# Patient Record
Sex: Female | Born: 1990 | Race: Black or African American | Hispanic: No | Marital: Single | Smoking: Never smoker
Health system: Southern US, Community
[De-identification: ages and names within clinical notes are randomized; demographics above are authoritative.]

## PROBLEM LIST (undated history)

## (undated) DIAGNOSIS — J45909 Unspecified asthma, uncomplicated: Secondary | ICD-10-CM

## (undated) DIAGNOSIS — J189 Pneumonia, unspecified organism: Secondary | ICD-10-CM

## (undated) DIAGNOSIS — R569 Unspecified convulsions: Secondary | ICD-10-CM

## (undated) DIAGNOSIS — G809 Cerebral palsy, unspecified: Secondary | ICD-10-CM

## (undated) HISTORY — PX: BACK SURGERY: SHX140

## (undated) HISTORY — PX: HIP SURGERY: SHX245

---
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
## 2010-01-25 ENCOUNTER — Encounter: Admission: RE | Admit: 2010-01-25 | Discharge: 2010-01-25 | Payer: Self-pay | Admitting: General Surgery

## 2010-01-25 ENCOUNTER — Ambulatory Visit: Payer: Self-pay | Admitting: General Surgery

## 2010-08-09 ENCOUNTER — Ambulatory Visit: Payer: Self-pay | Admitting: General Surgery

## 2011-04-06 LAB — COMPREHENSIVE METABOLIC PANEL
ALT: 16 U/L (ref 0–35)
AST: 21 U/L (ref 0–37)
Albumin: 3.1 g/dL — ABNORMAL LOW (ref 3.5–5.2)
Alkaline Phosphatase: 206 U/L — ABNORMAL HIGH (ref 39–117)
BUN: 1 mg/dL — ABNORMAL LOW (ref 6–23)
CO2: 25 mEq/L (ref 19–32)
Calcium: 8.5 mg/dL (ref 8.4–10.5)
Chloride: 104 mEq/L (ref 96–112)
Creatinine, Ser: <0.3 mg/dL — ABNORMAL LOW (ref 0.4–1.2)
Glucose, Bld: 91 mg/dL (ref 70–99)
Potassium: 4.2 mEq/L (ref 3.5–5.1)
Sodium: 136 mEq/L (ref 135–145)
Total Bilirubin: 0.4 mg/dL (ref 0.3–1.2)
Total Protein: 6.6 g/dL (ref 6.0–8.3)

## 2011-05-10 NOTE — Discharge Summary (Signed)
Allison [name], Allison [name]              ACCOUNT NO.:  [id number]   MEDICAL RECORD NO.:  [id number]          PATIENT TYPE:  INP   LOCATION:  6126                         FACILITY:  [facility]   PHYSICIAN:  Dyann Ruddle, MDDATE OF BIRTH:  [birth year]   DATE OF ADMISSION:  04/20/2009  DATE OF DISCHARGE:  04/23/2009                               DISCHARGE SUMMARY   REASON FOR HOSPITALIZATION:  Aspiration event.   FINAL DIAGNOSES:  1. Aspiration.  2. MRCP.  3. Failure to thrive.  4. Seizure disorder.   BRIEF HOSPITAL COURSE:  An 20 year old female with history of MRCP,  admitted status post aspiration event while eating associated with  hypoxia to 70% and new O2 requirement.  The patient was placed on nasal  cannula O2 to keep sats greater than 90%, 88% while asleep.  Initially  made n.p.o. and placed on maintenance IV fluids.  Chest x-ray was  obtained upon admission, which did not show any evidence of infiltrate  or consolidation with aspiration event.  Speech Therapy was consulted  and obtained modified barium swallow evaluation as the patient took thin  liquids at home.  The patient failed modified barium swallow study with  all consistencies including nectar thick.  Oxygen was weaned throughout  admission and the patient was greater than 92% on room air prior to  discharge.  Upper GI study was done as the patient will need G-tube for  further feeding in the long-term setting.  Upper GI revealed normal  anatomy with no evidence of reflux.  No further studies were done as an  inpatient.  The patient will follow up with Pediatric Surgery for G-tube  placement as an outpatient.  Of note, secondary to failed modified  barium swallow study, Panda NG tube was placed and the patient was  started on Ensure tube feeds initially at 6 mL and increase to daily  feeds of 120 mL q.i.d.  At discharge, the patient's physical exam was  pertinent for improved coarse breath sounds bilaterally with no  wheezing.  No retractions.  O2 sat of 94% on room air.   DISCHARGE WEIGHT:  16 kg.   DISCHARGE CONDITION:  Improved.   DISCHARGE DIET:  Ensure NG tube feeds 120 mL q.i.d.   DISCHARGE ACTIVITY:  Ad lib.   PROCEDURES AND OPERATIONS:  Upper GI, normal anatomy, no reflux.   CONSULTANT:  Outpatient Pediatric Surgery for G-tube placement.   HOME MEDICATIONS:  1. Lyrica 75 mg per NG daily.  2. Keppra 1500 mg per NG q.a.m., 1000 mg per NG q.p.m.  3. Felbatol 600 mg per NG b.i.d.  4. Clonazepam 0.5 mg per NG t.i.d.   NEW MEDICATIONS:  Ensure NG feeds 120 mL q.i.d.   DISCONTINUED MEDICATIONS:  None.   PENDING RESULTS:  None.   FOLLOWUP ISSUES:  G-tube placement.   FOLLOWUP:  Dr. Wyline Mood, Pediatric Surgery, on Apr 27, 2009, at 2:45 p.m.  Dr. Oliver Pila, [facility] as needed.      Milinda Antis, MD  Electronically Signed      Dyann Ruddle, MD  Electronically Signed    KD/MEDQ  D:  04/23/2009  T:  04/24/2009  Job:  [id number]   cc:   Linward Headland, M.D.  Bunnie Pion, MD

## 2011-05-13 NOTE — Consult Note (Signed)
Allison [name], Allison [name]                          ACCOUNT NO.:  [id number]   MEDICAL RECORD NO.:  [id number]                   PATIENT TYPE:  INP   LOCATION:  6151                                 FACILITY:  [facility]   PHYSICIAN:  Casimiro Needle L. Thad Ranger, M.D.           DATE OF BIRTH:  03/08/1991   DATE OF CONSULTATION:  DATE OF DISCHARGE:  05/17/2003                                   CONSULTATION   CHIEF COMPLAINT:  Increased seizure activity.   HISTORY OF PRESENT ILLNESS:  This is an inpatient consultation on this  existing [facility] patient, a 20 year old girl with a  past medical history of quadriparetic cerebral palsy and mental retardation  and epilepsy with refractory seizures. Her mother reports that her normal  seizure frequency is about 0 to 5 complex partial seizures a day consisting  of eye fluttering, twitching of the left face and arm. She seemed to be in  her usual state of health. She may have had a few more twitches than usual  last night, but otherwise seemed to be doing quite well when she went off to  [place] this morning. However, once there she was noted to have very  frequent seizures, about 20 to 25 in a row, and then began having apparent  generalized seizure activity with twitching of all extremities. The EMS was  alerted and she received 2 mg of rectal Valium en route. She has had no  further  generalized seizures. She does, however, continue to have frequent  complex partial seizures. She received a fosphenytoin load in the emergency  room and neurologic consultation is requested for further management advice.   PAST MEDICAL HISTORY:  As above. No other chronic  medical problems.   SOCIAL HISTORY:  She lives at home and her mother is the primary care giver.  She is at [employer] during the day. She can take p.o., but is clearly  dependent in activities of daily living.   MEDICATIONS:  1. Felbatol 600 mg per teaspoon 3 cc b.i.d.  2. Klonopin  0.5 mg b.i.d.   REVIEW OF SYSTEMS:  On questioning her mother denies any fever, cough,  congestion, runny nose, nausea, diarrhea or apparent pain.   PHYSICAL EXAMINATION:   VITAL SIGNS:  Temperature 99.4, blood pressure 127/63, pulse 109,  respirations 32.   GENERAL:  The patient appears alert and in no evident distress. She seems to  regard the examiner and grunts occasionally but does not follow commands and  does not produce any formed speech.   NECK:  Supple without carotid bruits.   CHEST:  Clear  to auscultation.   HEART:  Regular rate and rhythm.   NEUROLOGIC:  Cranial nerves, pupils equal and reactive. Extraocular  movements seem to be full. Face was symmetric. On motor examination  she has  increased tone throughout with fair severe quadriparesis. She does withdraw  all extremities to stimuli. Reflexes are  generally increased  and Babinski  signs are present bilaterally.   LABORATORY DATA:  CBC :  White count 11.9, hemoglobin 13.7, platelets  270,000. BMET unremarkable. Urinalysis and urine culture are pending. Blood  culture is pending.   Chest x-ray is normal.   IMPRESSION:  1. 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 The reason is uncertain but there is no definite     infectious focus and the patient has not missed any medication doses.  2. Quadriparetic cerebral palsy.  3. Mental retardation.   PLAN:  The plan was discussed with Dr. Sharene Skeans. We will increase the  Felbatol to 4 cc b.i.d. Dilantin would be an appropriate drug to use as a  bridge. Pediatrics will proceed with a fever workup.  If she feels better it  is OK for her to go home in the morning to follow up with Dr. Sharene Skeans.                                               Michael L. Thad Ranger, M.D.    MLR/MEDQ  D:  05/16/2003  T:  05/17/2003  Job:  [id number]

## 2011-05-13 NOTE — Consult Note (Signed)
Allison [name], Allison [name]              ACCOUNT NO.:  [id number]   MEDICAL RECORD NO.:  [id number]          PATIENT TYPE:  OBV   LOCATION:  6118                         FACILITY:  [facility]   PHYSICIAN:  Bevelyn Buckles. Champey, M.D.DATE OF BIRTH:  1991-12-06   DATE OF CONSULTATION:  05/29/2006  DATE OF DISCHARGE:                                   CONSULTATION   REQUESTING PHYSICIAN:  Dr. Dawna Part   REASON FOR CONSULTATION:  Recurrent seizures.   HISTORY OF PRESENT ILLNESS:  [name] is a 20 year old African-American  female, history of static encephalopathy, mental retardation, seizures, and  neonatal hypoxic ischemic insult.  Presents with increased frequency of  seizures over the past few days.  The patient does see Dr. Sharene Skeans as an  outpatient and is currently on Felbatol and clonazepam for seizure control.  Her last Felbatol level was 90 on May 25, 2006.  Mother states that seizures  have been increasing in frequency over the past few days and have been  fluctuating since Keppra was stopped over a year ago.  She can have no  seizures for a few days and then two to three per day for several days.  The  patient has had numerous seizures this morning, over 20, and mostly consist  of right eye deviation and right upper extremity shaking, usually lasting 10-  20 seconds in duration.  The patient occasionally will come back to baseline  as per mother and then go into another seizure.  The patient's baseline  consists of severe mental retardation, being nonverbal and quadriparetic.  The patient did have a few seizures during this evaluation.   PAST MEDICAL HISTORY:  Positive for static encephalopathy, quadriparetic,  mental retardation, and seizures.   CURRENT MEDICATIONS:  Include Felbatol and clonazepam.   ALLERGIES:  The patient has no known drug allergies.   FAMILY HISTORY:  Negative for seizures.   SOCIAL HISTORY:  The patient lives with mother and sister.  Has mental  retardation.   Is mostly wheelchair bound.   REVIEW OF SYSTEMS:  Positive as per history of present illness.  Is negative  as per history of present illness and also negative for recent illnesses or  ill contacts.   EXAMINATION:  VITAL SIGNS:  Temperature is 97.8, respirations 24, pulse is  93, blood pressure is 126/82.  HEENT:  The patient has a right gaze preference/deviation.  Pupils are equal  and reactive.  HEART:  Regular.  LUNGS:  Clear.  ABDOMEN:  Soft, nontender.  EXTREMITIES:  Show increased tonal contractures, good pulses throughout.  NEUROLOGIC:  The patient is lethargic, nonresponsive, nonverbal.  Cranial  nerves:  The patient has right eye deviation.  Pupils equal, round, and  reactive to light.  Motor examination:  The patient is quadriparetic with  increased tonal contractures in all four extremities.  Reflexes are brisk  throughout.  Cerebellar function and gait are unable to be performed at this  time.   LABORATORY:  Felbatol level on May 25, 2006, was 90.   IMPRESSION:  A 20 year old African-American female with recurrent  seizures/status.  The patient was  already given 2 mg of Ativan in the ED.  After reviewing the old clinic notes of Dr. Sharene Skeans I will add Keppra 1 g  IV x1 now and start the patient on maintenance 500 mg b.i.d.  Will check a  EEG as the tech has been called.  I would recommend ruling out infection  with UA and panculture for other etiologies for increased seizure frequency.  Can use Ativan p.r.n. if needed.  Will continue the Felbatol and clonazepam  as previously prescribed and I will discuss the patient with Dr. Sharene Skeans  who will follow the patient in the morning.  Dr. Dawna Part is told to call with  any questions or concerns.      Bevelyn Buckles. Nash Shearer, M.D.  Electronically Signed     DRC/MEDQ  D:  05/29/2006  T:  05/30/2006  Job:  [id number]

## 2011-05-13 NOTE — Discharge Summary (Signed)
NAMECASSI, Allison [name]                          ACCOUNT NO.:  [id number]   MEDICAL RECORD NO.:  [id number]                   PATIENT TYPE:  INP   LOCATION:  6149                                 FACILITY:  [facility]   PHYSICIAN:  Dwana Curd. Para March, M.D.              DATE OF BIRTH:  1991/04/27   DATE OF ADMISSION:  08/28/2004  DATE OF DISCHARGE:  08/31/2004                                 DISCHARGE SUMMARY   REASON FOR HOSPITALIZATION:  Increasing frequency in seizure activities.   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  Mom reports an increasing frequency in seizures for 2 days before  ED admission.  Dr. Sharene Skeans previously increased the patient's Felbatol dose  but the Mom stated the patient still had an increase in number of seizures.  The patient was given Ativan in the ER secondary to the increased seizure  activity.  The patient was admitted to the 6th floor of [facility]  observation to the pediatric unit.  Nutrition consult was completed for  cachexia.  The PCP faxed a growth chart but is still concerned about the  patient's growth and development.   TREATMENT:  1.  Felbatol 500 mg p.o./NG b.i.d.  2.  Clonazepam 0.5 mg p.o./NG b.i.d.  3.  Keppra 100 mg p.o./NG b.i.d.  4.  Tylenol 325 mg p.o. q.4 h. p.r.n. fever.  5.  Nutrition consult leading to PediaSure 30 mL p.o./NG.   OPERATIONS AND PROCEDURES:  None.   FINAL DIAGNOSIS:  Seizure disorder with increased frequency of seizure now  resolving.   DISCHARGE MEDICATIONS:  1.  Keppra 100 mg per 1 mL solution 1 mL b.i.d.  2.  Clonazepam 0.5 mg p.o./NG b.i.d.  3.  Felbatol 500 mg p.o./NG b.i.d.   DIET:  Pureed diet, continue with normal.  Would follow up with primary care  Emogene Muratalla.   PENDING RESULTS/ISSUES TO BE RESOLVED:  Cachexia/low weight/poor growth to  be followed up by PCP.   FOLLOW UP:   Follow up with Dr. Oliver Pila on Friday, September 9th, at 9:30  a.m.   DISCHARGE WEIGHT:  Not available.   DISCHARGE CONDITION:  Improved.                                                Dwana Curd Para March, M.D.    GSD/MEDQ  D:  08/31/2004  T:  09/01/2004  Job:  [id number]   cc:   Linward Headland, M.D.  [address] [place]  [place] [address]  Fax: [phone]

## 2011-05-13 NOTE — Consult Note (Signed)
Allison [name], BOZARTH              ACCOUNT NO.:  [id number]   MEDICAL RECORD NO.:  [id number]          PATIENT TYPE:  INP   LOCATION:  6151                         FACILITY:  [facility]   PHYSICIAN:  Casimiro Needle L. Reynolds, M.D.DATE OF BIRTH:  04/17/1991   DATE OF CONSULTATION:  DATE OF DISCHARGE:                                   CONSULTATION   REFERRING PHYSICIAN:  Orie Rout, M.D.   REASON FOR EVALUATION:  Seizures.   HISTORY OF PRESENT ILLNESS:  This is an inpatient consultation evaluation of  this existing [facility] patient., a 20 year old girl  with a past medical history that includes extreme prematurity with resultant  microcephaly, quadriparesis and refractory epilepsy with complex partial  seizures.  The patient has been admitted several times with refractory  seizures, most recently in June of this year.  Her present regimen of  medication includes Felbatol 480 mg b.i.d. and Keppra 750 mg b.i.d.  She is  followed in the office by Dr. Sharene Skeans.  The patient was in her usual state  of health until Sunday afternoon, at which time she began having increased  seizure frequency.  Her mother says that she normally has 0 to 3 seizures a  day, but began having frequent seizures on Sunday afternoon, October 01, 2006.  On October 02, 2006, the seizure reduced in frequency a little bit,  but still she was having seizures more frequently than on an hourly basis.  This morning, she began having seizures very frequently, about every 5  minutes or so, and she was also noted to be febrile at home, so she was  brought to the emergency room for evaluation.  The patient's seizures are  described as her usual episodes of adversive seizures with extension on the  right, flexion on the left, associated with head deviation.  The spells last  20 to 30 seconds and then resolve, and there is no postictal confusional  state noted by her mother.  She has not noticed any change in  the patient's  behavior lately.  She continues to tolerate food well.  She has not had any  obvious signs of infection such as runny nose, cough, redness of the skin,  or any other signs.  She has been able to keep down her medications.  She  has not had any vomiting or diarrhea.  In the emergency room, she received  lorazepam and fosphenytoin load and was subsequently admitted for further  observation and management.   PAST MEDICAL HISTORY:  Remarkable for a history of extreme prematurity with  substantial subsequent neurologic injury resulting in mental retardation,  spastic quadriplegia, cerebral palsy, and refractory epilepsy as noted  above.  She has been on several anticonvulsives in the past.  No medical  problems otherwise.   FAMILY/SOCIAL/REVIEW OF SYSTEMS:  As outlined in the admission H and P,  which is reviewed.   MEDICATIONS:  1. Klonopin 0.5 mg b.i.d.  2. Keppra 750 mg b.i.d.  3. Felbatol 480 mg b.i.d.   PHYSICAL EXAMINATION:  VITAL SIGNS:  Temperature 98.3, heart rate 132,  respirations 20, O2  sat 98% on room air, weight 16 kilos.  GENERAL:  This is a chronically ill-appearing girl, supine in the hospital  bed in no evident distress.  HEAD:  Cranium is microcephalic without evidence of acute trauma.  Oropharynx is benign.  NECK:  Supple without carotid bruit.  HEART:  Tachycardic, regular rhythm.  No murmurs.  NEUROLOGIC:  Mental status:  She appears a little bit groggy, but is able to  regard the examiner.  She makes no effort at speech and does not follow  commands.  Cranial nerves:  Pupils are equal and reactive.  She did look to  the left and the right equally well and blinks to threat from both sides.  She has increased facial tone, but moves face and palate slowly and  symmetrically.  MOTOR:  Normal bulk.  She has increased tone with a posture  as spastic quadriparesis.  She is able to demonstrate antigravity strength  in the extremities.  She is able to  withdrawal extremities to pin.  Reflexes  are brisk throughout.  Toes are upgoing bilaterally.   LABORATORY DATA:  CBC, on admission, white count 8.5, hemoglobin 13.7,  platelet 183,000 with a normal differential.  Urinalysis is negative.  CMET  remarkable for an elevated AST and ALT.  Otherwise normal.   IMPRESSION:  Breakthrough partial seizures in a patient with a long history  of refractory epilepsy.  It is unclear what, if anything, precipitated this  episode.   RECOMMENDATIONS:  Would proceed with additional Keppra intravenously and  would increase her regular dose to 1000 mg b.i.d.  For now, will continue  Felbatol at the present dose.  Would hold off on any further dilantin,  unless the seizures do not remit.  Would also hold off on any further  Ativan, unless she develops refractory status, that is a persistent seizure  lasting 5 minutes or more.  She had an EEG today, which will be interpreted  by Dr. Sharene Skeans, who will perform subsequent follow up on the patient.   Thank you for the consultation.      Michael L. Thad Ranger, M.D.  Electronically Signed     MLR/MEDQ  D:  10/03/2006  T:  10/04/2006  Job:  [id number]

## 2011-05-13 NOTE — Procedures (Signed)
PROCEDURE:  Portable electroencephalogram.   NEUROLOGIST:  Bevelyn Buckles. Nash Shearer, M.D.   HISTORY:  This is a 20 year old with increased frequency of seizures.   CURRENT MEDICATIONS:  Felbatol and Klonopin.   DESCRIPTION OF PROCEDURE:  On this portable EEG, there is no distinct or  sustained posterior dominant rhythm noted.  Background activity is fairly  symmetric and mostly comprised of mixed frequency activity and mostly theta  range activity at 10-50 microvolts.  There is superimposed faster beta  activity noticed throughout the majority of the tracing, and there is also  occasional EMG movement artifact which occasionally obscures the background.  There seem to be more prominent beta activity noted in the left greater than  right frontal head region.  There is also on two occassions that there are  frontally predominant beta spikes for 5 seconds duration, that tapers off  with slowing.  It is unclear what is occurring during this timeframe as no  documentation is given.  This could possibly be an electrographic seizure  versus artifact. Occasionally in the left temporal head region there is  occasional small sharp waves noted.  The patient was getting Ativan and  Keppra prior to this tracing.  Photic stimulation nor hyperventilation were  performed throughout this recording.   IMPRESSION:  This portable EEG is abnormal secondary to diffuse slowing,  questionable right hemispheric sharp waves and beta activity most prominent  in the left>right head regions.  Also, two episodes suggestive of  electrographic seizures versus artifact.  The diffuse slowing suggestive of  a toxic metabolic or primary neuronal disorder.  The right hemispheric sharp  waves are suggestive of cortical irritation and possible seizure focus in  the presence of beta in the background that is highly suggestive of  medication effect.  The frontal beta/spikes are also suggestive of seizure  focus.  One would suggest  video/long term monitoring to further delineate.  Clinical correlation is advised.      Bevelyn Buckles. Nash Shearer, M.D.  Electronically Signed     [id number]  D:  05/29/2006 14:54:57  T:  05/30/2006 11:31:48  Job #:  [id number]

## 2011-05-13 NOTE — Procedures (Signed)
EEG NUMBER:  [id number].   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  She was recently admitted  with a flurry of generalized seizures (345.10).   Medications include Felbatol, Lyrica and clonazepam.   PROCEDURE:  The tracing is carried out on a 32-channel digital Cadwell  recorder reformatted into 16-channel montages with one devoted to EKG.  The  patient was awake and asleep during the recording.  The [employer] 10/20  system lead placement was used.   DESCRIPTION OF FINDINGS:  The background activity in the waking state is a  mixture of 4-5 Hz, 20 mV activity.  Superimposed upon this is polymorphic  delta range activity of up to 30 mV.   The most striking finding in the record was spike and slow wave activity of  30 mV spike,  50 mV slow wave activity centered around the central vertex  and to a lesser extent frontal and parietal vertex regions.  These were  interictal discharges which at times were quite frequent.  These became  somewhat more frequent as the patient became drowsy and drifted into natural  sleep with the background consistent showing predominant delta range  activity and sleep spindles.  The sharp wave activity was superimposed upon  vertex sharp waves.   No electrographic seizures were seen.   EKG showed regular sinus rhythm with ventricular response of 96 beats per  minute.   IMPRESSION:  Abnormal EEG on the basis of diffuse background slowing and  significant for the presence of sharply contoured slow wave activity at the  central, frontal and parietal vertex which is epileptogenic from  electrographic viewpoint would correlate with the presence of a seizure  disorder.  Diffuse slowing is indicative of the patient's underlying static  encephalopathy.      Deanna Artis. Sharene Skeans, M.D.  Electronically  Signed     ZOX:WRUE  D:  10/10/2006 07:19:48  T:  10/10/2006 21:00:10  Job #:  [id number]

## 2011-05-13 NOTE — Procedures (Signed)
EEG NUMBER:  [id number].   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   The patient was admitted with frequent episodes of 15- to 30-second episodes  of flexing of her right arm, head turning to the right, and flickering of  her eyelids.  This happens every day, but it is now happening in clusters  despite the fact that she is taking Keppra, Felbatol, and Klonopin.  The  study is being done to evaluate the patient's seizure disorder and make  recommendations for further workup and treatment, 345.01.   PROCEDURE:  The tracing is carried out on a 32-channel digital Cadwell  recorder reformatted into 16 channel montages with one devoted to EKG.  The  patient was awake and drowsy during the recording.  The International 10-20  system of lead placement was used.   DESCRIPTION OF FINDINGS:  The background activity is a mixture of lower  theta/upper delta range activity of 30-40 microvolts and generalized 16-20  Hz 20 microvolt beta range activity.   The patient has independent sharply-contoured slow waves at 01, C3 and CC.   There are two episodes of 20-25 seconds.  They begin with 10 Hz 50 microvolt  alpha range activity that is generalized and transitioned to 5 Hz spike and  slow wave activity lasting for 15 seconds.  The patient has extension of her  arm during the tonic portion of the episode and some clonic activity during  the 5 Hz spike and wave.  The second episode is lasts for 30 seconds and is  evenly split between the 10 Hz tonic phase and the 5 Hz clonic phase.  The  patient is aroused toward the end and has a brief 10 Hz discharge lasting  for 2-3 seconds but this is not associated with any seizure-like behavior.   IMPRESSION:  Abnormal EEG on the basis of  diffuse profound background  slowing.  The patient also has two generalized seizures, lasting 25 and 30  seconds, with generalized tonic activity at the beginning and tonic-clonic  activity at the end.  This is consistent with the patient's history.     Deanna Artis. Sharene Skeans, M.D.  Electronically Signed    ZOX:WRUE  D:  10/03/2006 15:25:31  T:  10/05/2006 08:51:56  Job #:  [id number]   cc:   Pediatric Teaching Service

## 2011-05-13 NOTE — H&P (Signed)
NAMEWILHELMENA, Allison [name]                          ACCOUNT NO.:  [id number]   MEDICAL RECORD NO.:  [id number]                   PATIENT TYPE:  INP   LOCATION:  1827                                 FACILITY:  [facility]   PHYSICIAN:  Genene Churn. Love, M.D.                 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   HISTORY OF PRESENT ILLNESS:  The patient was a 7 pound 15-1/2 ounce product  of a full-term pregnancy, complicated by maternal back pain.  The patient  was noted to have slowed breathing and underwent C-section.  She had  seizures within the first 24 hours and a suspected anoxic brain injury.  She  has had inability to sit, walk, talk, but does verbalize with some baby  sounds.  She can see and opens her mouth when eating when requested.  Her  focal seizures have been characterized by eyes jerkins with nystagmoid  movements on the right, batting of the eyelids, flexion of the right arm at  the elbow with stiffness.  These usually last about 15 seconds.  She  averages about 0-2 per day and can go as long as two days without a seizure.  Recently on Thursday, August 26, 2004, she began having increased  frequency of seizures and Dr. Sharene Skeans increased her Felbatol from 4 mL  b.i.d. to 5 mL b.i.d., which is a total of 1000 mg per day going to 1200 mg  per day.  She has continued to have frequent right face, eye, and arm  seizures and was seen in the emergency room for evaluation.  There has been  no fever or chills or other change in her appetite.  She has been on  Dilantin in the past.  She has been phenobarbital, Tegretol, and Neurontin.  She has not been on Trileptal, Lamictal, Zonegran, Depakote, or  Topamax.  She has been on Felbatol 4 mL b.i.d. and __________ 0.5 mg b.i.d. until  August 26, 2004.  She has had no other medicines in the last 10 years  according to mom.   PAST MEDICAL HISTORY:  1.  Quadriparesis.  2.  Static encephalopathy.  3.  Left brain focal seizures.  4.  Spinal fusion at [facility] in March 2005 from cervical region to the      sacral bone.  5.  Right and left hip surgery in 1997 and repeat in 1999.   She has no known allergies.   MEDICATIONS:  1.  Felbatol, recently increased to 5 mL b.i.d., which would be 600 mg      b.i.d.  2.  Clonazepam 0.5 mg b.i.d.  PHYSICAL EXAMINATION:  GENERAL:  A well-developed, thin black female lying  in bed.  VITAL SIGNS:  Blood pressure lying, right and left arm with an adult cuff  was 70/40, heart rate was 105, she was afebrile.  NEUROLOGIC:  She had episodes in which she would look at the examiner.  There was no speech.  The pupils were reactive.  Both discs were seen and  flat.  The extraocular movements were full.  She had gag.  She had extension  of her right arm, flexion of both legs at the knees, and flexion of her  right arm at the elbow.  She has evidence of a spastic quadriparesis with  increased tone and clonus at both ankles.  She had bilateral upgoing plantar  responses.  HEENT:  General examination revealed the tympanic membrane to be clear.  CHEST:  Lungs were clear.  CARDIAC:  There were no heart murmurs.  ABDOMEN:  Bowel sounds were normal.   IMPRESSION:  1.  Recurrent left brain focal seizures, code 345.41.  2.  Spastic quadriparesis, code 344.01.  3.  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  4.  Scoliosis, status post spinal fusion, code 737.43.   PLAN:  The plan at this time since the patient has received 15 mg of  diazepam per rectum is to give her 1 mg of Ativan IV, load with 290 mg of  Dilantin to be given in fosphenytoin equivalents, then decrease her Felbatol  to 4 mL  b.i.d., continue Klonopin, clonazepam 0.5 mg b.i.d., and start  Keppra 85 mg b.i.d.                                                Genene Churn. Sandria Manly, M.D.    JML/MEDQ  D:  08/28/2004  T:  08/28/2004  Job:  [id number]   cc:   Linward Headland, M.D.  [address] [place]  [place] [address]  Fax: [phone]

## 2011-05-13 NOTE — Discharge Summary (Signed)
Allison [name], Allison [name]              ACCOUNT NO.:  [id number]   MEDICAL RECORD NO.:  [id number]          PATIENT TYPE:  OBV   LOCATION:  6118                         FACILITY:  [facility]   PHYSICIAN:  Henrietta Hoover, MD    DATE OF BIRTH:  Dec 27, 1990   DATE OF ADMISSION:  05/29/2006  DATE OF DISCHARGE:  06/01/2006                                 DISCHARGE SUMMARY   DISCHARGE DIAGNOSES:  1.  Cerebral palsy.  2.  Intractable complex partial seizures.  3.  Spastic quadriplegic.  4.  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   DISCHARGE MEDICATIONS:  1.  Keppra 750 mg p.o. b.i.d.  2.  Clonazepam 0.5 mg 1 tablet t.i.d.  3.  Sorbitol 600 mg/5 mL, 4 mL twice daily.   FOLLOW UP:  The patient is to follow with Dr. Sharene Skeans at [facility] at [phone] in one month.  The patient's mother is  to call to schedule an appointment.   BRIEF HOSPITAL COURSE:  Allison [name] is a 20 year old female with past medical  history significant for spastic quadriplegia, seizure disorder, cerebral  palsy, who was admitted for intractable seizures on May 29, 2006.  The  patient was recently seen by Dr. Sharene Skeans one week prior and had recently  had her medications increased at that time. The patient was brought into the  ER per mom secondary to her seizures increasing from baseline. The patient  has a baseline number of seizures multiple times a day but anywhere from 1-2  times a day that last approximately 20 seconds with eyes rolling back in the  head, facial twitching, and arm twitching, and these resolve to a postictal  state.  The patient is nonverbal and is a spastic quadriplegic.  The patient  was given Ativan while in the ER x2 and was seen by the neurologist.  Dr.  Sharene Skeans was seen. She had an EEG performed and a BOEEG and the results are  pending per their final dictation as noted in E-Chart.  It was noted that  the patient continued to have similar focus as noted  prior with the complex  partial seizures as well as minor motor seizures.  The patient was started  on Keppra during this hospitalization and it was decided to be discharged  home on 750 mg p.o. b.i.d. as well as her home meds as above.  The patient  will be followed up by Dr. Sharene Skeans in approximately one month.  Mom states  that the  seizures have reduced, however, they still respond with stimuli, but she  felt comfortable and the patient was able to take p.o. per mom feeding her a  pureed diet.  The patient's discharge weight is 16.8 kg. Discharge condition  is stable.  The patient will be followed and mom is aware of the plan and  the patient is now ready for discharge home.      Barth Kirks, M.D.    ______________________________  Henrietta Hoover, MD    MB/MEDQ  D:  06/01/2006  T:  06/01/2006  Job:  [id number]   cc:  Linward Headland, M.D.  Fax: [phone]   Deanna Artis. Sharene Skeans, M.D.  Fax: [phone]

## 2011-05-13 NOTE — Discharge Summary (Signed)
Allison [name], Allison [name]              ACCOUNT NO.:  [id number]   MEDICAL RECORD NO.:  [id number]          PATIENT TYPE:  INP   LOCATION:  6151                         FACILITY:  [facility]   PHYSICIAN:  Deanna Artis. Hickling, M.D.DATE OF BIRTH:  08/02/1991   DATE OF ADMISSION:  10/03/2006  DATE OF DISCHARGE:  10/10/2006                                 DISCHARGE SUMMARY   REASON FOR HOSPITALIZATION:  Khalie is a 20 year old female with a history  of cerebral palsy, as well as a seizure disorder.  She was admitted for an  increased frequency of seizures.   FINAL DIAGNOSES:  1. 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  2. Cerebral palsy Spastic Quadriparesis (343.2).   HOSPITAL COURSE:  The patient had seizures every 1 to 2 minutes upon  hospital admission.  According to her mother, her baseline frequency of  seizures is 1 to 3 seizures per day.  The patient was loaded with Dilantin  IV in the emergency department.  Dr. Thad Ranger from neurology evaluated the  patient in the emergency department and increased her home dose of Keppra  from 750 mg b.i.d. to 1000 mg b.i.d.  An EEG was obtained that did capture 2  generalized seizure.  The patient underwent an infectious workup, which  resulted in negative urine and blood cultures.  A comprehensive metabolic  panel and CBC were both unremarkable.  The patient continued to have  seizures approximately every 3 to 4 minutes until Lyrica was added to her  seizure regimen on October 13.  The patient's seizure frequency returned  back to her baseline.  An EEG performed on October 15, showed generalized  slowing with no seizure activity.  The patient was noted to be difficult to  feed and did not swallow her medications readily.  A bedside speech swallow  evaluation was performed on October 16.  The patient was unable to comply  with the swallow evaluation for the speech technician.  The patient's mother  was, however, successful in feeding the  patient and administering oral  medications.  The mother is comfortable with taking the patient home and  feels that her swallowing ability is at her baseline.   DISCHARGE MEDICATIONS:  1. Lyrica 100 mg p.o. twice daily, this is a new medication as of this      hospitalization.  2. Keppra 1000 mg p.o. b.i.d., this medication has been increased from 750      mg p.o. b.i.d.  3. Klonopin 0.5 mg p.o. t.i.d.  4. Felbatol 480 mg p.o. b.i.d.  5. Dilantin 40 mg p.o. every 8 hours.   FOLLOWUP INSTRUCTIONS:  The patient is to follow up with Dr. Ellison Carwin on October 18 at 12:45 p.m.  The patient's mother is aware of this  appointment time.   ISSUES TO BE FOLLOWED UP UPON:  The patient will need to have her Dilantin  level checked at her appointment with Dr. Sharene Skeans on October 18.   A preliminary discharge summary has been faxed to the patient's primary  physician, Dr. Oliver Pila at [facility], as well as to Dr. Ellison Carwin  who has followed the patient throughout this hospitalization.     ______________________________  Sylvan Cheese, M.D.      Deanna Artis. Sharene Skeans, M.D.  Electronically Signed    MJ/MEDQ  D:  10/10/2006  T:  10/11/2006  Job:  [id number]

## 2011-05-13 NOTE — Procedures (Signed)
EEG NUMBER:  [id number].   HISTORY:  This is a 20 year old with intractable seizures who is having  prolonged video EEG monitoring to evaluate for frequency of seizures.   PROCEDURE:  This is prolonged video EEG monitoring.   TECHNICAL DESCRIPTION:  This prolonged video EEG monitoring was performed on  May 30, 2006.  The tracing was from 9:20 a.m. to 11:43 a.m.  Throughout this  several hour prolonged video EEG monitoring there are numerous push button  events noted, up to 17.  Each of these push buttons correlate with  stereotypical events which consist of the patient extending her hand,  opening her mouth, staring off.  Occasionally seems to have some mouth or  tongue twitching, draws up her arms with flexion and increased tone in her  upper extremities.  This episodes usually lasts anywhere from eight to 10  seconds in duration and then the patient opens her eyes and stares off in  the gaze.  All these push button events electrographically are correlated  with the starting of more prominent beta activity frontally followed by  increased slow wave activity from frontally followed by increased  generalized beta activity at 15-16 Hz at 300 microvolts discharges.  These  discharges last eight to 10 seconds in duration and these discharges ceases  with the background consisting of generalized frontally predominant slowing  in the background.  Clinically when the discharge stop patient becomes more  relaxed and opens her eyes.  Each of these push buttons correlate with this  electrographic seizure event as described above and clinically are the same  stereotypical events with each push button.  These electrographic seizures  are of short duration, only a few seconds in duration with the extensive  beta discharge followed by generalized slowing.  The background of this  tracing is fairly symmetric, mostly comprised of mixed frequency activity.  There does at times seem to be more prominent beta  activity and frontally  predominant slowing noted.  The prominent beta is mostly in the left greater  than right frontal head regions   IMPRESSION:  This prolonged video EEG monitoring is abnormal secondary to  numerous push button events that correlate with electrographic seizure  discharge as described above.  These findings were discussed with Dr.  Ellison Carwin on May 30, 2006.  Clinical correlation is advised.      Bevelyn Buckles. Nash Shearer, M.D.  Electronically Signed     ZOX:WRUE  D:  06/01/2006 12:18:46  T:  06/02/2006 07:36:52  Job #:  [id number]

## 2011-10-06 LAB — I-STAT 8, (EC8 V) (CONVERTED LAB)
Acid-Base Excess: 2
Bicarbonate: 25.7 — ABNORMAL HIGH
Glucose, Bld: 85
HCT: 47
Hemoglobin: 16
Operator id: 279831
Potassium: 4.6
Sodium: 138
TCO2: 27

## 2011-10-06 LAB — URINALYSIS, ROUTINE W REFLEX MICROSCOPIC
Bilirubin Urine: NEGATIVE
Glucose, UA: NEGATIVE
Ketones, ur: NEGATIVE
Protein, ur: NEGATIVE

## 2011-10-06 LAB — URINE CULTURE: Culture: NO GROWTH

## 2012-10-27 ENCOUNTER — Encounter (HOSPITAL_COMMUNITY): Payer: Self-pay | Admitting: Emergency Medicine

## 2012-10-27 ENCOUNTER — Inpatient Hospital Stay (HOSPITAL_COMMUNITY)
Admission: EM | Admit: 2012-10-27 | Discharge: 2012-11-06 | DRG: 100 | Disposition: A | Discharge status: Home / Self Care | Payer: 59 | Attending: Neurology | Admitting: Neurology

## 2012-10-27 ENCOUNTER — Emergency Department (HOSPITAL_COMMUNITY): Payer: 59

## 2012-10-27 DIAGNOSIS — G809 Cerebral palsy, unspecified: Secondary | ICD-10-CM | POA: Diagnosis present

## 2012-10-27 DIAGNOSIS — Q02 Microcephaly: Secondary | ICD-10-CM

## 2012-10-27 DIAGNOSIS — I498 Other specified cardiac arrhythmias: Secondary | ICD-10-CM | POA: Diagnosis present

## 2012-10-27 DIAGNOSIS — G40901 Epilepsy, unspecified, not intractable, with status epilepticus: Secondary | ICD-10-CM | POA: Diagnosis present

## 2012-10-27 DIAGNOSIS — R569 Unspecified convulsions: Secondary | ICD-10-CM | POA: Diagnosis present

## 2012-10-27 DIAGNOSIS — E876 Hypokalemia: Secondary | ICD-10-CM

## 2012-10-27 DIAGNOSIS — Z79899 Other long term (current) drug therapy: Secondary | ICD-10-CM

## 2012-10-27 DIAGNOSIS — G808 Other cerebral palsy: Secondary | ICD-10-CM | POA: Diagnosis present

## 2012-10-27 DIAGNOSIS — G40401 Other generalized epilepsy and epileptic syndromes, not intractable, with status epilepticus: Principal | ICD-10-CM | POA: Diagnosis present

## 2012-10-27 DIAGNOSIS — G40319 Generalized idiopathic epilepsy and epileptic syndromes, intractable, without status epilepticus: Secondary | ICD-10-CM

## 2012-10-27 DIAGNOSIS — G9349 Other encephalopathy: Secondary | ICD-10-CM | POA: Diagnosis present

## 2012-10-27 HISTORY — DX: Cerebral palsy, unspecified: G80.9

## 2012-10-27 HISTORY — DX: Unspecified convulsions: R56.9

## 2012-10-27 LAB — CBC WITH DIFFERENTIAL/PLATELET
Basophils Absolute: 0 10*3/uL (ref 0.0–0.1)
Eosinophils Absolute: 0.2 10*3/uL (ref 0.0–0.7)
Lymphocytes Relative: 28 % (ref 12–46)
Lymphs Abs: 2.2 10*3/uL (ref 0.7–4.0)
MCH: 31.9 pg (ref 26.0–34.0)
Neutrophils Relative %: 62 % (ref 43–77)
Platelets: 168 10*3/uL (ref 150–400)
RBC: 4.54 MIL/uL (ref 3.87–5.11)
RDW: 12.3 % (ref 11.5–15.5)
WBC: 8 10*3/uL (ref 4.0–10.5)

## 2012-10-27 LAB — URINALYSIS, ROUTINE W REFLEX MICROSCOPIC
Hgb urine dipstick: NEGATIVE
Leukocytes, UA: NEGATIVE
Nitrite: NEGATIVE
Protein, ur: NEGATIVE mg/dL
Specific Gravity, Urine: 1.007 (ref 1.005–1.030)
Urobilinogen, UA: 0.2 mg/dL (ref 0.0–1.0)

## 2012-10-27 LAB — BASIC METABOLIC PANEL
GFR calc non Af Amer: >90 mL/min (ref >90)
Glucose, Bld: 80 mg/dL (ref 70–99)
Potassium: 3.8 mEq/L (ref 3.5–5.1)
Sodium: 139 mEq/L (ref 135–145)

## 2012-10-27 MED ORDER — LORAZEPAM 2 MG/ML IJ SOLN
1.0000 mg | INTRAMUSCULAR | Status: DC | PRN
  Administered 2012-10-27 – 2012-11-02 (×13): 1 mg via INTRAVENOUS
  Filled 2012-10-27 (×10): qty 1

## 2012-10-27 MED ORDER — LORAZEPAM 2 MG/ML IJ SOLN
INTRAMUSCULAR | Status: AC
  Administered 2012-10-27: 1 mg
  Filled 2012-10-27: qty 1

## 2012-10-27 MED ORDER — SODIUM CHLORIDE 0.9 % IV SOLN
500.0000 mg | INTRAVENOUS | Status: AC
  Administered 2012-10-27: 500 mg via INTRAVENOUS
  Filled 2012-10-27: qty 5

## 2012-10-27 MED ORDER — FELBAMATE 600 MG/5ML PO SUSP
600.0000 mg | Freq: Two times a day (BID) | ORAL | Status: DC
  Administered 2012-10-27 – 2012-10-28 (×2): 600 mg
  Filled 2012-10-27 (×4): qty 5

## 2012-10-27 MED ORDER — CLONAZEPAM 0.1 MG/ML ORAL SUSPENSION
1.0000 mg | Freq: Once | ORAL | Status: AC
  Administered 2012-10-27: 1 mg via ORAL
  Filled 2012-10-27: qty 10

## 2012-10-27 MED ORDER — ASPIRIN 81 MG PO CHEW: 324.0000 mg | CHEWABLE_TABLET | ORAL | Status: AC

## 2012-10-27 MED ORDER — SODIUM CHLORIDE 0.9 % IV SOLN: 250.0000 mL | INTRAVENOUS | Status: DC | PRN

## 2012-10-27 MED ORDER — LEVETIRACETAM 500 MG PO TABS
500.0000 mg | ORAL_TABLET | Freq: Once | ORAL | Status: AC
  Administered 2012-10-27: 500 mg via ORAL
  Filled 2012-10-27: qty 1

## 2012-10-27 MED ORDER — DEXTROSE-NACL 5-0.45 % IV SOLN
INTRAVENOUS | Status: DC
  Administered 2012-10-28: 07:00:00 via INTRAVENOUS

## 2012-10-27 MED ORDER — CLONAZEPAM 0.5 MG PO TABS
0.5000 mg | ORAL_TABLET | Freq: Three times a day (TID) | ORAL | Status: DC
Start: 2012-10-27 — End: 2012-11-06
  Administered 2012-10-27 – 2012-11-06 (×29): 0.5 mg via ORAL
  Filled 2012-10-27 (×29): qty 1

## 2012-10-27 MED ORDER — PREGABALIN 75 MG PO CAPS
75.0000 mg | ORAL_CAPSULE | Freq: Every day | ORAL | Status: DC
  Administered 2012-10-28 – 2012-11-05 (×9): 75 mg via ORAL
  Filled 2012-10-27 (×9): qty 1

## 2012-10-27 MED ORDER — FELBAMATE 600 MG/5ML PO SUSP: 600.0000 mg | Freq: Two times a day (BID) | ORAL | Status: DC

## 2012-10-27 MED ORDER — SODIUM CHLORIDE 0.9 % IV SOLN
1500.0000 mg | Freq: Two times a day (BID) | INTRAVENOUS | Status: DC
  Administered 2012-10-28 – 2012-11-06 (×19): 1500 mg via INTRAVENOUS
  Filled 2012-10-27 (×21): qty 15

## 2012-10-27 MED ORDER — ASPIRIN 300 MG RE SUPP
300.0000 mg | RECTAL | Status: AC
  Administered 2012-10-27: 300 mg via RECTAL
  Filled 2012-10-27: qty 1

## 2012-10-27 MED ORDER — IBUPROFEN 100 MG/5ML PO SUSP
200.0000 mg | ORAL | Status: DC | PRN
  Filled 2012-10-27: qty 10

## 2012-10-27 MED ORDER — SODIUM CHLORIDE 0.9 % IV SOLN
1500.0000 mg | INTRAVENOUS | Status: AC
  Filled 2012-10-27: qty 15

## 2012-10-27 MED ORDER — LORAZEPAM 2 MG/ML IJ SOLN
1.0000 mg | Freq: Once | INTRAMUSCULAR | Status: AC
  Administered 2012-10-27: 1 mg via INTRAVENOUS
  Filled 2012-10-27: qty 1

## 2012-10-27 MED ORDER — HEPARIN SODIUM (PORCINE) 5000 UNIT/ML IJ SOLN
5000.0000 [IU] | Freq: Three times a day (TID) | INTRAMUSCULAR | Status: DC
  Administered 2012-10-27 – 2012-10-29 (×5): 5000 [IU] via SUBCUTANEOUS
  Filled 2012-10-27 (×8): qty 1

## 2012-10-27 MED ORDER — SODIUM CHLORIDE 0.9 % IV BOLUS (SEPSIS)
500.0000 mL | Freq: Once | INTRAVENOUS | Status: AC
  Administered 2012-10-27: 1000 mL via INTRAVENOUS

## 2012-10-27 MED ORDER — SODIUM CHLORIDE 0.9 % IV BOLUS (SEPSIS)
500.0000 mL | Freq: Once | INTRAVENOUS | Status: AC
  Administered 2012-10-27: 500 mL via INTRAVENOUS

## 2012-10-27 MED ORDER — SODIUM CHLORIDE 0.9 % IV SOLN: 1000.0000 mg | Freq: Once | INTRAVENOUS | Status: DC

## 2012-10-27 NOTE — ED Notes (Signed)
IV team arrived to establish line.   Unsuccessful previous attempts by Jae Dire, RN and Shelley Cocke, RN.

## 2012-10-27 NOTE — ED Notes (Signed)
Called report to Clydie Braun, [place] in [phone].

## 2012-10-27 NOTE — ED Provider Notes (Signed)
Allison [name] is a 21 y.o. female in CDU for observation from pod A. Sign out from PA Upstill as follows: Patient has past medical history significant for cerebral palsy and seizure disorder. She presents today with seizure, fever and emesis. Neurology was consult the has recommended supplementation of her Keppra additional 500 mg in addition to 1 mg of Klonopin. They have advised [place] to observe her for several hours and see if her seizure activity decreases. The seizure activity decreases the plan is to increase her Keppra dose as follows: Currently patient takes 1500 mg in a.m. And 1000 mg at night. Increase to 1500 at night for 2 nights or until no further fever. Follow up with neurology next week.   1:29 PM patient seen and examined. Mother not present. Patient is sleeping, nonverbal. In no acute distress. Lung sounds are clear to auscultation bilaterally heart is regular rate and rhythm and abdominal exam is nontender with no peritoneal signs.  Filed Vitals:   10/27/12 1106 10/27/12 1115 10/27/12 1215 10/27/12 1312  BP: 101/64 103/58 107/65 112/63  Pulse: 75 83 84 91  Temp:      TempSrc:      Resp: 18     SpO2: 100% 97% 99% 99%   Patient signed out to NP Katrinka Blazing at shift change.   Wynetta Emery, PA-C 10/29/12 [phone]

## 2012-10-27 NOTE — ED Notes (Signed)
Family at bedside. 

## 2012-10-27 NOTE — ED Provider Notes (Signed)
Patient continues to have frequent, short interval seizures. Per recommendation of neurology Devonne Doughty), patient received additional 500 mg IV dose of keppra.  One hour after completion of IV dose, patient continues to have frequent seizures.  Neurology inquired if patient could be admitted to pediatrics.  Discussed case with pediatrics resident, who indicates that her attending advises patient cannot be admitted to pediatrics service as patient was recently discharged from her prior pediatric practice.  Patient admission request made to unassigned medicine (TH/AC team 2).  Due to frequency and persistence of seizures, admitting service changed to CCM--patient will be admitted to ICU.  Jimmye Norman, NP 10/28/12 [phone]

## 2012-10-27 NOTE — ED Notes (Signed)
Patients mother claims that patient started running fevers on Thursday.  Patient started seizing Tuesday afternoon but states that the "fevers make her seize more".

## 2012-10-27 NOTE — ED Notes (Signed)
Report given to Greg, RN.

## 2012-10-27 NOTE — ED Provider Notes (Signed)
History     CSN: [id number]  Arrival date & time 10/27/12  1191   First MD Initiated Contact with Patient 10/27/12 [phone]      Chief Complaint  Patient presents with  . Seizures  . Fever    (Consider location/radiation/quality/duration/timing/severity/associated sxs/prior treatment) HPI Pt is a 21 yo female with cerebral palsy who presents to the ER with fever and seizures.  History is provided by her mother who appears to be an excellent caregiver.  Per mother fevers have been on and off for 3 days.  Highest temperature was 101F.  Pt is on several medications for seizures and occasionally has breakthrough seizures.  Patient has had increased number of seizures. Up to 10 seizures yesterday. Pt has PEG tube.  Pt is usually nonverbal.  Pt is usually more alert than she is now, otherwise no baseline change in her mental function.  Pt has had no upper respiratory symptoms.  Mother denies diarrhea, vomiting, changes in urination, urine malodor, and sick contacts.    Past Medical History  Diagnosis Date  . Seizures   . Cerebral palsy     Past Surgical History  Procedure Date  . Back surgery   . Hip surgery     bilateral    No family history on file.  History  Substance Use Topics  . Smoking status: Never Smoker   . Smokeless tobacco: Not on file  . Alcohol Use: No    OB History    Grav Para Term Preterm Abortions TAB SAB Ect Mult Living                  Review of Systems  Constitutional: Positive for fever.  Respiratory: Negative for cough.   Gastrointestinal: Negative for vomiting and diarrhea.  Genitourinary: Negative for difficulty urinating.  Skin: Negative for rash and wound.  Neurological: Positive for seizures.  ROS per caregiver  Allergies  Review of patient's allergies indicates no known allergies.  Home Medications  No current outpatient prescriptions on file.  BP 90/72  Pulse 95  Temp 97.8 F (36.6 C) (Oral)  Resp 22  SpO2 100%  Physical Exam    Constitutional: She appears well-developed and well-nourished. No distress.  HENT:  Head: Normocephalic and atraumatic.  Mouth/Throat: Mucous membranes are dry.  Pulmonary/Chest: Effort normal and breath sounds normal. No respiratory distress.  Abdominal: Soft. She exhibits no distension. There is no tenderness.  Neurological: She is alert.       Patient is nonverbal, does not follow command at baseline. She is awake and follows movement. Actively seizing - generalized seizure without vomiting of 15-20 second duration.   Skin: Skin is warm and dry. No rash noted.    ED Course  Procedures (including critical care time)   Labs Reviewed  CBC WITH DIFFERENTIAL  BASIC METABOLIC PANEL  URINALYSIS, ROUTINE W REFLEX MICROSCOPIC   Results for orders placed during the hospital encounter of 10/27/12  CBC WITH DIFFERENTIAL      Component Value Range   WBC 8.0  4.0 - 10.5 K/uL   RBC 4.54  3.87 - 5.11 MIL/uL   Hemoglobin 14.5  12.0 - 15.0 g/dL   HCT 95.6  21.3 - 08.6 %   MCV 92.1  78.0 - 100.0 fL   MCH 31.9  26.0 - 34.0 pg   MCHC 34.7  30.0 - 36.0 g/dL   RDW 57.8  46.9 - 62.9 %   Platelets 168  150 - 400 K/uL   Neutrophils Relative  62  43 - 77 %   Neutro Abs 4.9  1.7 - 7.7 K/uL   Lymphocytes Relative 28  12 - 46 %   Lymphs Abs 2.2  0.7 - 4.0 K/uL   Monocytes Relative 7  3 - 12 %   Monocytes Absolute 0.6  0.1 - 1.0 K/uL   Eosinophils Relative 3  0 - 5 %   Eosinophils Absolute 0.2  0.0 - 0.7 K/uL   Basophils Relative 1  0 - 1 %   Basophils Absolute 0.0  0.0 - 0.1 K/uL  BASIC METABOLIC PANEL      Component Value Range   Sodium 139  135 - 145 mEq/L   Potassium 3.8  3.5 - 5.1 mEq/L   Chloride 101  96 - 112 mEq/L   CO2 25  19 - 32 mEq/L   Glucose, Bld 80  70 - 99 mg/dL   BUN 9  6 - 23 mg/dL   Creatinine, Ser 2.44 (*) 0.50 - 1.10 mg/dL   Calcium 9.5  8.4 - 01.0 mg/dL   GFR calc non Af Amer >90  >90 mL/min   GFR calc Af Amer >90  >90 mL/min  URINALYSIS, ROUTINE W REFLEX MICROSCOPIC       Component Value Range   Color, Urine YELLOW  YELLOW   APPearance CLEAR  CLEAR   Specific Gravity, Urine 1.007  1.005 - 1.030   pH 6.0  5.0 - 8.0   Glucose, UA NEGATIVE  NEGATIVE mg/dL   Hgb urine dipstick NEGATIVE  NEGATIVE   Bilirubin Urine NEGATIVE  NEGATIVE   Ketones, ur 40 (*) NEGATIVE mg/dL   Protein, ur NEGATIVE  NEGATIVE mg/dL   Urobilinogen, UA 0.2  0.0 - 1.0 mg/dL   Nitrite NEGATIVE  NEGATIVE   Leukocytes, UA NEGATIVE  NEGATIVE    No results found.   No diagnosis found.    MDM  Reviewed old chart. Patient continues to seize, with increasing frequency per mom. Klonpin ordered, 1mg . Discussed with neurology who recommended 500 mg Keppra dose now as well as Klonopin and monitor for 2 hours for improvement to seizures. If improved and seizures decrease in frequency, patient can be discharged with dosage increase of Keppra as follows:  Currently patient takes 1500 mg in a.m. And 1000 mg at night. Increase to 1500 at night for 2 nights or until no further fever. Follow up with neurology next week.   Move patient to CDU to monitor for seizure improvement.        Rodena Medin, PA-C 10/27/12 1308

## 2012-10-27 NOTE — ED Provider Notes (Signed)
Medical screening examination/treatment/procedure(s) were conducted as a shared visit with non-physician practitioner(s) and myself.  I personally evaluated the patient during the encounter   Loren Racer, MD 10/27/12 1504

## 2012-10-27 NOTE — ED Notes (Signed)
CT called, patient finished contrast

## 2012-10-27 NOTE — Progress Notes (Signed)
Called to see Pt Allison [name]  21 year old female with cerebral palsy and seizures followed by Dr. Sharene Skeans.per PA in the EDsince arrival this a.m.(at about 9:00 AM)she has been having seizures-about 2 per hour lasting about 10-35 seconds at a time, and patient regaining consciousness between episodes, but this has been persistent despite receiving her outpatient Keppra Klonopin and an additional dose of IV Keppra as directed per neurologist on-call-Dr. [phone]) at the time of my visit about an hour following the IV Keppra patient is still having seizures-2 generalized tonic-clonic seizures eachLasting a few seconds at a time.I spoke with the on-call neurologist-Dr. Devonne Doughty and he states if patient is continuing to have seizures she'll need to be admitted to the ICU and he can be contacted at [phone].I discussed patient with Dr. Judithann Sheen. Link she states CCM MD will see her for admission to ICU.  Donnalee Curry MD [facility] [phone]

## 2012-10-27 NOTE — H&P (Signed)
Name: Allison [name] MRN: [id number] DOB: 1991/10/30    LOS: 0  Referring Provider:  Jimmye Norman, PA Reason for Referral:  Status epilepticus  PULMONARY / CRITICAL CARE MEDICINE  HPI:  Allison [name] is a 21 y/o female with past medical history of cerebral palsy and mental retardation with a long standing history of seizures previously controlled on 4 home agents.  Her mother brought her to the [facility] ED today with 4 days of fever and increased seizure activity.  Her mother reports no other associated symptoms, only fever to 101 for the last 4 days and seizures about twice an hour that have increased in frequency from her baseline of 0-1 per day.  Allison [name] is on keppra, felbemate, klonipin and lyrica for seizures and is usually well controlled unless she gets a fever or other illness.  She was last hospitalized for about 3 years ago and at that time she was hosplitalized for wheezing, not for seizures.  She had basic labs in the ED which were all normal (BMP, CBC) a chest x-ray with no evidence of infiltrate, and a UA which was not suspicious for UTI.  Her mother reports that she has not had any coughing, increased rhinorrhea or congestion, no diarrhea or constipation, no vomiting and no evidence of pain.  Allison [name] is non-verbal at baseline but does vocalize by cooing, she smiles and is interactive.  Her mother reports that between seizures she has been returning to her baseline.  While I was seeing her in the ED she had tonic clonic seizures starting with eye deviation, then marching down her face and then her arms.  These would last 20-30 seconds and occur every few minutes.  She would become tachycardic with these events but would not desaturate.  Her mother reports that she has been having seizures with this frequency since this morning.   Past Medical History  Diagnosis Date  . Seizures   . Cerebral palsy    Past Surgical History  Procedure Date  . Back surgery   . Hip surgery     bilateral    Prior to Admission medications   Medication Sig Start Date End Date Taking? Authorizing Provider  clonazePAM (KLONOPIN) 0.5 MG tablet Take 0.5 mg by mouth 3 (three) times daily.   Yes Historical Provider, MD  felbamate (FELBATOL) 600 MG/5ML suspension Take 5 mg by mouth 2 (two) times daily. 1 teaspoonful twice per day   Yes Historical Provider, MD  ibuprofen (ADVIL,MOTRIN) 100 MG/5ML suspension Take 200 mg by mouth every 4 (four) hours as needed. For fever   Yes Historical Provider, MD  levETIRAcetam (KEPPRA) 1000 MG tablet Take 1,000-1,500 mg by mouth 2 (two) times daily. 1.5 tablets in the morning and 1 tablet at night   Yes Historical Provider, MD  pregabalin (LYRICA) 75 MG capsule Take 75 mg by mouth daily.   Yes Historical Provider, MD   Allergies No Known Allergies  Family History History reviewed. No pertinent family history. No family history of sz disorder Social History  reports that she has never smoked. She does not have any smokeless tobacco history on file. She reports that she does not drink alcohol or use illicit drugs.  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 seizure disorder admitted in status epilepticus  Events Since Admission: Increase in keppra dosing  Current Status: Critical  Vital Signs: Temp:  [97.7  F (36.5 C)-98.6 F (37 C)] 98.6 F (37 C) (11/02 1622) Pulse Rate:  [75-128] 128  (11/02 2000) Resp:  [16-22] 16  (11/02 1844) BP: (90-134)/(58-93) 119/73 mmHg (11/02 2000) SpO2:  [95 %-100 %] 95 % (11/02 2000)  Physical Examination: General:  Laying on stretcher, non-verbal, non-interactive, frequent seizures Neuro:   left arm contracted, frequent tonic clonic seizures, reflexes 2+ HEENT:  PERRL, EOMI, OP clear, no tonisillar exudate, +tonsillar hypertrophy, no erythema Neck:  Supple, moves head from left to right willingly, shotty cervical  LAD  Cardiovascular:  Tachycardic, regular rhythm, no mrg  Lungs:  CTAB Abdomen:  Soft, NTND, +BS, G-tube in place  Musculoskeletal:  Left arm contracted at elbow, legs stiff Skin:  No rash, intact  Principal Problem:  *Status epilepticus Active Problems:  Seizures  Cerebral palsy   ASSESSMENT AND PLAN  PULMONARY No results found for this basename: PHART:5,PCO2:5,PCO2ART:5,PO2ART:5,HCO3:5,O2SAT:5 in the last 168 hours Ventilator Settings:   CXR:  No infiltrate ETT:  none  A:  History of wheezing, no current problems P:   Continuous pulse ox O2 if needed with seizure activity  CARDIOVASCULAR No results found for this basename: TROPONINI:5,LATICACIDVEN:5, O2SATVEN:5,PROBNP:5 in the last 168 hours ECG: sinus tachycardic Lines: none  A: Sinus tacchycardia P:  Bolus NS 500cc Likely related to persistent seizure activity.  Continuous cardiac monitoring IV fluids 50cc/hr  RENAL  Lab 10/27/12 0940  NA 139  K 3.8  CL 101  CO2 25  BUN 9  CREATININE 0.29*  CALCIUM 9.5  MG --  PHOS --   Intake/Output    None    Foley:  none  A:  No current problems P:   IVF d51/2 at 50cc/hr until seizures controlled  GASTROINTESTINAL No results found for this basename: AST:5,ALT:5,ALKPHOS:5,BILITOT:5,PROT:5,ALBUMIN:5 in the last 168 hours  A:  No current problems P:   Home feeds are jevity 1 can TID, will hold until seizure activity controlled.   HEMATOLOGIC  Lab 10/27/12 0940  HGB 14.5  HCT 41.8  PLT 168  INR --  APTT --   A:  No current problems P:  Monitor cbc as needed  INFECTIOUS  Lab 10/27/12 0940  WBC 8.0  PROCALCITON --   Cultures: none Antibiotics: none  A:  Fever with no obvious source of infection. P:   Pt has been afebrile since admission with normal white count and normal UA Culture blood and urine with new fever Consider LP with new fever Hold on antibiotics with no source.  ENDOCRINE No results found for this basename: GLUCAP:5 in  the last 168 hours A:  No current problems   P:   Monitor blood sugar on BMP in AM  NEUROLOGIC  A:  Status epilepticus in pt with known seizure disorder P:   Seen by neurology - recommended increaseing keppra dose to 1500mg  BID Continue home felbamate, klonipin and lyrica Ativan 1mg  IV for seizure lasting longer than 5 min, or more than 3 seizures of any duration in 5 minute period.   BEST PRACTICE / DISPOSITION Level of Care:  ICU Primary Service:  PCCM Consultants:  neurology Code Status:  full Diet:  jevity via tube DVT Px:  Heparin/asa GI Px:  none Skin Integrity:  good Social / Family:  Mother at bedside.   Carolan Clines., M.D. Pulmonary and Critical Care Medicine [facility] Pager: [phone]  I spent 30 minutes of critical care time in the care of this patient.   10/27/2012, 8:25 PM

## 2012-10-27 NOTE — ED Notes (Signed)
Called pharmacy stated will be sending medication secured code as soon as possible.

## 2012-10-27 NOTE — ED Notes (Signed)
Patient continues to have seizures, more frequently.

## 2012-10-27 NOTE — ED Notes (Signed)
Patient has a peg tube left of midline that is capped.

## 2012-10-27 NOTE — ED Notes (Signed)
I inserted the in and out and Mellody Dance the emt held her legs and we cleaned her up.10:33am JG

## 2012-10-28 ENCOUNTER — Inpatient Hospital Stay (HOSPITAL_COMMUNITY): Payer: 59

## 2012-10-28 DIAGNOSIS — G40401 Other generalized epilepsy and epileptic syndromes, not intractable, with status epilepticus: Principal | ICD-10-CM

## 2012-10-28 LAB — BASIC METABOLIC PANEL
BUN: 7 mg/dL (ref 6–23)
BUN: 4 mg/dL — ABNORMAL LOW (ref 6–23)
CO2: 17 mEq/L — ABNORMAL LOW (ref 19–32)
CO2: 20 mEq/L (ref 19–32)
Calcium: 8.6 mg/dL (ref 8.4–10.5)
Calcium: 8.8 mg/dL (ref 8.4–10.5)
Creatinine, Ser: 0.29 mg/dL — ABNORMAL LOW (ref 0.50–1.10)
Creatinine, Ser: 0.28 mg/dL — ABNORMAL LOW (ref 0.50–1.10)

## 2012-10-28 LAB — CBC
MCH: 31.6 pg (ref 26.0–34.0)
MCV: 93.1 fL (ref 78.0–100.0)
Platelets: 173 10*3/uL (ref 150–400)
RBC: 3.89 MIL/uL (ref 3.87–5.11)
RDW: 12.4 % (ref 11.5–15.5)

## 2012-10-28 LAB — LACTIC ACID, PLASMA: Lactic Acid, Venous: 1.1 mmol/L (ref 0.5–2.2)

## 2012-10-28 LAB — MRSA PCR SCREENING: MRSA by PCR: NEGATIVE

## 2012-10-28 MED ORDER — FELBAMATE 600 MG/5ML PO SUSP
480.0000 mg | ORAL | Status: DC
  Administered 2012-10-28 – 2012-11-06 (×10): 480 mg
  Filled 2012-10-28 (×11): qty 4

## 2012-10-28 MED ORDER — FELBAMATE 600 MG/5ML PO SUSP
480.0000 mg | ORAL | Status: DC
  Administered 2012-10-29 – 2012-11-06 (×9): 480 mg via ORAL
  Filled 2012-10-28 (×11): qty 4

## 2012-10-28 MED ORDER — DEXTROSE-NACL 5-0.45 % IV SOLN
INTRAVENOUS | Status: DC
Start: 2012-10-28 — End: 2012-10-29
  Administered 2012-10-28: 75 mL/h via INTRAVENOUS

## 2012-10-28 MED ORDER — FELBAMATE 600 MG/5ML PO SUSP
360.0000 mg | ORAL | Status: DC
  Administered 2012-10-28 – 2012-11-05 (×9): 360 mg via ORAL
  Filled 2012-10-28 (×10): qty 3

## 2012-10-28 NOTE — Consult Note (Signed)
Allison [name] is an 21 y.o. female. MRN: [id number] DOB: 01-05-1991  Reason for Consult: Evaluation for frequent seizures  Referring Physician: D. 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  She generally has infrequent seizures. She has been followed by Dr. Sharene Skeans in our  practice, on multiple antiepileptic medications with fairly good control in the past few years. She was brought to ED  with a few days of low-grade fever and increased seizure activity. Her mother reports no other associated symptoms, only fever to 101, she has had mild cold symptoms, no fall, no change in her medications, no missing medication, seizures about twice an hour that have increased in frequency from her baseline of 0-1 per day. Allison [name] is on keppra, felbemate, klonipin and lyrica for seizures. She received extra 500 mg of Keppra and 1 mg of Klonopin, she continued frequent brief seizures, she received another 500 mg of Keppra but the brief seizures continued and she was admitted in ICU for monitoring. She had basic labs in the ED which were all normal (BMP, CBC) a chest x-ray with no evidence of infiltrate, and a UA which was not suspicious for UTI. Allison [name] is non-verbal at baseline. During my exam in ICU she had a few brief seizures lasting 20-30 seconds, with lateral gaze of the eyes to the right and stiffness and jerking movements of the right upper extremity and then generalized activity, resolve spontaneously.   Past Medical History  Diagnosis Date  . Seizures   . Cerebral palsy    Prior to Admission medications   Medication Sig Start Date End Date Taking? Authorizing Provider  clonazePAM (KLONOPIN) 0.5 MG tablet Take 0.5 mg by mouth 3 (three) times daily.   Yes Historical Provider, MD  felbamate  (FELBATOL) 600 MG/5ML suspension Take 5 mg by mouth 2 (two) times daily. 1 teaspoonful twice per day   Yes Historical Provider, MD  ibuprofen (ADVIL,MOTRIN) 100 MG/5ML suspension Take 200 mg by mouth every 4 (four) hours as needed. For fever   Yes Historical Provider, MD  levETIRAcetam (KEPPRA) 1000 MG tablet Take 1,000-1,500 mg by mouth 2 (two) times daily. 1.5 tablets in the morning and 1 tablet at night   Yes Historical Provider, MD  pregabalin (LYRICA) 75 MG capsule Take 75 mg by mouth daily.   Yes Historical Provider, MD   Results for orders placed during the hospital encounter of 10/27/12 (from the past 48 hour(s))  CBC WITH DIFFERENTIAL     Status: Normal   Collection Time   10/27/12  9:40 AM      Component Value Range Comment   WBC 8.0  4.0 - 10.5 K/uL    RBC 4.54  3.87 - 5.11 MIL/uL    Hemoglobin 14.5  12.0 - 15.0 g/dL    HCT 95.6  21.3 - 08.6 %    MCV 92.1  78.0 - 100.0 fL    MCH 31.9  26.0 - 34.0 pg    MCHC 34.7  30.0 - 36.0 g/dL    RDW 57.8  46.9 - 62.9 %    Platelets 168  150 - 400 K/uL    Neutrophils Relative 62  43 - 77 %    Neutro Abs 4.9  1.7 - 7.7 K/uL    Lymphocytes Relative 28  12 - 46 %    Lymphs Abs 2.2  0.7 -  4.0 K/uL    Monocytes Relative 7  3 - 12 %    Monocytes Absolute 0.6  0.1 - 1.0 K/uL    Eosinophils Relative 3  0 - 5 %    Eosinophils Absolute 0.2  0.0 - 0.7 K/uL    Basophils Relative 1  0 - 1 %    Basophils Absolute 0.0  0.0 - 0.1 K/uL   BASIC METABOLIC PANEL     Status: Abnormal   Collection Time   10/27/12  9:40 AM      Component Value Range Comment   Sodium 139  135 - 145 mEq/L    Potassium 3.8  3.5 - 5.1 mEq/L    Chloride 101  96 - 112 mEq/L    CO2 25  19 - 32 mEq/L    Glucose, Bld 80  70 - 99 mg/dL    BUN 9  6 - 23 mg/dL    Creatinine, Ser 4.54 (*) 0.50 - 1.10 mg/dL    Calcium 9.5  8.4 - 09.8 mg/dL    GFR calc non Af Amer >90  >90 mL/min    GFR calc Af Amer >90  >90 mL/min   URINALYSIS, ROUTINE W REFLEX MICROSCOPIC     Status: Abnormal    Collection Time   10/27/12 10:32 AM      Component Value Range Comment   Color, Urine YELLOW  YELLOW    APPearance CLEAR  CLEAR    Specific Gravity, Urine 1.007  1.005 - 1.030    pH 6.0  5.0 - 8.0    Glucose, UA NEGATIVE  NEGATIVE mg/dL    Hgb urine dipstick NEGATIVE  NEGATIVE    Bilirubin Urine NEGATIVE  NEGATIVE    Ketones, ur 40 (*) NEGATIVE mg/dL    Protein, ur NEGATIVE  NEGATIVE mg/dL    Urobilinogen, UA 0.2  0.0 - 1.0 mg/dL    Nitrite NEGATIVE  NEGATIVE    Leukocytes, UA NEGATIVE  NEGATIVE MICROSCOPIC NOT DONE ON URINES WITH NEGATIVE PROTEIN, BLOOD, LEUKOCYTES, NITRITE, OR GLUCOSE <1000 mg/dL.  MRSA PCR SCREENING     Status: Normal   Collection Time   10/27/12 10:31 PM      Component Value Range Comment   MRSA by PCR NEGATIVE  NEGATIVE   CBC     Status: Normal   Collection Time   10/28/12  4:30 AM      Component Value Range Comment   WBC 9.5  4.0 - 10.5 K/uL    RBC 3.89  3.87 - 5.11 MIL/uL    Hemoglobin 12.3  12.0 - 15.0 g/dL    HCT 11.9  14.7 - 82.9 %    MCV 93.1  78.0 - 100.0 fL    MCH 31.6  26.0 - 34.0 pg    MCHC 34.0  30.0 - 36.0 g/dL    RDW 56.2  13.0 - 86.5 %    Platelets 173  150 - 400 K/uL   BASIC METABOLIC PANEL     Status: Abnormal   Collection Time   10/28/12  4:30 AM      Component Value Range Comment   Sodium 137  135 - 145 mEq/L    Potassium 3.5  3.5 - 5.1 mEq/L    Chloride 106  96 - 112 mEq/L    CO2 17 (*) 19 - 32 mEq/L    Glucose, Bld 74  70 - 99 mg/dL    BUN 7  6 - 23 mg/dL    Creatinine,  Ser 0.29 (*) 0.50 - 1.10 mg/dL    Calcium 8.6  8.4 - 16.1 mg/dL    GFR calc non Af Amer >90  >90 mL/min    GFR calc Af Amer >90  >90 mL/min     Physical Exam Blood pressure 108/62, pulse 124, temperature 99.7 F (37.6 C), temperature source Axillary, resp. rate 24, height 3' 10.06" (1.17 m), weight 63 lb 14.9 oz (29 kg), last menstrual period 10/26/2012, SpO2 96.00%.  Gen:  Awake, lying in bed.  Skin: No rash, skin stigmata such as hemangioma, pigmentation,    Heent: Severe microcephalic,  no conjunctival injection, nares patent, mucous membranes  Neck: stiff, no meningismus. No cervical bruit. Resp: Clear to auscultation bilaterally CV: Regular rate, normal S1/S2, no, rubs, or gallops Abd: Bowel sounds present, abdomen soft,non-distended.  No hepatosplenomegaly or masses palpable.PEG tube in place Extrem: She has significant flexion contracture of all extremities with decreased muscle mass.  Neurological examination: MS - Awake, lying in bed, non-verbal, slightly interactive and seems to follow with her eyes briefly although she is legally blind, she has significant flexion contracture of upper and lower extremities. Cranial Nerves - Pupils were equal and slightly reactive (3 to 2mm); no APD, optic disc not visualized,  EOM with roving eye movements and mild horizontal nystagmus; face symmetric, there was no ptosis, tongue was in midline,  Strength - unable to assess, very tight joints do to chronic contractures.   Reflexes - brisk reflexes with sustained clonus of both feet   Plantar responses extensor bilaterally Sensation - withdraw to noxious stimuli Coordination -unable to assess     Assessment/Plan Allison [name] is a 21 year old young female with microcephaly, severe spastic quadriplegia and intractable epilepsy, has been on several medications with fairly good control. She has been admitted with increased seizure frequency in the past few days with no significant response to extra dose of Keppra and Klonopin in the past 24 hours. Seizures are focal with secondary generalization and all are brief less than  30 seconds with return of consciousness to baseline in between episodes.  She most likely has viral illness with low-grade fever but if she continues fever, occasionally some of the AEDs such as felbamate may cause slight hyperthermia but this is a rare side effect.   At this point, my recommendations are as follows:  Increase Keppra from 2500 mg  to 3000 mg a day (1500 bid) which would be around 100 mg per KG per day which is higher than usual dose but has been used for patients with severe encephalopathy and intractable seizures. I recommend to switch felbamate from 5 mL twice a day to 3 times a day, every 8 hours intervals at  4ml- 46ml-4ml,  for more sustained blood level. (45mg /kg/day)  I recommend to perform a head CT if she continues frequent seizures to rule out acute intracranial pathology such as subdural hematoma.  I would schedule for a prolonged EEG for probably 3-4 hours to assess the frequency of electrographic seizures and the need for possible replacing or adding another medication. This could be done tomorrow morning. If she continued prolonged or more frequent seizures, the next option would be starting fosphenytoin or add Onfi to her AED regimen.  Since patient is on Felbamate and has not had liver function tests recently, I recommend either to add LFT to her previous blood work if possible or check the LFT at some point prior to discharge. I talked to Allison [name]'s mother on the phone and discussed  the plan of slight change in her current medications and the possibility of starting a new medication if needed and also to perform possible head CT and EEG.  I discussed the plan with Dr. Tyson Alias, ICU attending.  I will follow the patient,  please call [phone] for any questions.  Chart neurology attending Keturah Shavers 10/28/2012, 9:05 AM

## 2012-10-28 NOTE — ED Provider Notes (Signed)
Medical screening examination/treatment/procedure(s) were performed by non-physician practitioner and as supervising physician I was immediately available for consultation/collaboration.   Glynn Octave, MD 10/28/12 [phone]

## 2012-10-28 NOTE — Progress Notes (Signed)
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   Current Status: Still having frequent seizures  Vital Signs: Temp:  [97.7 F (36.5 C)-99.7 F (37.6 C)] 99.7 F (37.6 C) (11/03 0400) Pulse Rate:  [75-139] 124  (11/03 0700) Resp:  [16-29] 24  (11/03 0700) BP: (97-143)/(43-93) 108/62 mmHg (11/03 0700) SpO2:  [91 %-100 %] 96 % (11/03 0700) Weight:  [29 kg (63 lb 14.9 oz)] 29 kg (63 lb 14.9 oz) (11/02 2200)  Physical Examination: General:  Laying in bed, non-verbal, non-interactive sNeuro:   left arm contracted, frequent tonic clonic seizures, reflexes 2+ HEENT:  PERRL, EOMI, OP clear, no tonisillar exudate, +tonsillar hypertrophy, no erythema Neck:  Supple, moves head from left to right willingly, shotty cervical LAD  Cardiovascular:  Tachycardic, regular rhythm, no mrg  Lungs:  CTAB Abdomen:  Soft, NTND, +BS, G-tube in place  Musculoskeletal:  Left arm contracted at elbow, legs stiff Skin:  No rash, intact  Principal Problem:  *Status epilepticus Active Problems:  Seizures  Cerebral palsy   ASSESSMENT AND PLAN  PULMONARY No results found for this basename: PHART:5,PCO2:5,PCO2ART:5,PO2ART:5,HCO3:5,O2SAT:5 in the last 168 hours Ventilator Settings:   CXR:  No infiltrate ETT:  none  A:  History of wheezing, no current problems P:   Continuous pulse ox O2 if needed with seizure activity No need ETT at this time  CARDIOVASCULAR No results found for this basename: TROPONINI:5,LATICACIDVEN:5, O2SATVEN:5,PROBNP:5 in the last 168  hours ECG: sinus tachycardic Lines: none  A: Sinus tachycardia Rate improved  P:  Likely related to persistent seizure activity.  Continuous cardiac monitoring IV fluids 70cc/hr, maintain  RENAL  Lab 10/28/12 0430 10/27/12 0940  NA 137 139  K 3.5 3.8  CL 106 101  CO2 17* 25  BUN 7 9  CREATININE 0.29* 0.29*  CALCIUM 8.6 9.5  MG -- --  PHOS -- --   Intake/Output      11/02 0701 - 11/03 0700 11/03 0701 - 11/04 0700   I.V. (mL/kg) 450 (15.5)    Total Intake(mL/kg) 450 (15.5)    Net +450         Urine Occurrence 5 x    Stool Occurrence 5 x     Foley:  none  A:   Mild metabolic acidosis, non-anion gap process, with concomitant + Anion gas  P:   Increased IVF d51/2 to 70cc/hr until seizures controlled Check lactate (for type b) F/u chemistry  Worsening NON AG process, avoid Nacl, may need bicarb Monitor for loose stools  GASTROINTESTINAL No results found for this basename: AST:5,ALT:5,ALKPHOS:5,BILITOT:5,PROT:5,ALBUMIN:5 in the last 168 hours  A:  No current problems P:   Add Home feeds are jevity 1 can TID, will Resume on 11/4, assuming seizures slow down   HEMATOLOGIC  Lab 10/28/12 0430 10/27/12 0940  HGB 12.3 14.5  HCT 36.2 41.8  PLT 173 168  INR -- --  APTT -- --   A:  No current problems P:  Monitor cbc as needed  INFECTIOUS  Lab 10/28/12 0430 10/27/12 0940  WBC  9.5 8.0  PROCALCITON -- --   Cultures: none Antibiotics: none  A:  Fever with no obvious source of infection. Low-grade, 99.7 Pt has been afebrile since admission with normal white count and normal UA P: watch fever curve and white blood cell count panculture if develops fever  ENDOCRINE No results found for this basename: GLUCAP:5 in the last 168 hours A:  No current problems   P:   Monitor blood sugar on BMP in AM  NEUROLOGIC  A:  Status epilepticus in pt with known seizure disorder Seen by peds neurology - still having seizures  P: recommended increaseing keppra  dose to 1500mg  BID Continue home felbamate, dose adjustments made Cont  klonipin and lyrica CT head, will order EEG 3-4 hrs  Ativan 1mg  IV for seizure lasting longer than 5 min, or more than 3 seizures of any duration in 5 minute period. Treat non ag in  This setting  BABCOCK,PETE 10/28/2012, 10:27 AM   I have fully examined this pt and agree with above. I examined, taken history, and managed  Mcarthur Rossetti. Tyson Alias, MD, FACP Pgr: [phone] [facility]

## 2012-10-29 ENCOUNTER — Inpatient Hospital Stay (HOSPITAL_COMMUNITY): Payer: 59

## 2012-10-29 DIAGNOSIS — E876 Hypokalemia: Secondary | ICD-10-CM | POA: Diagnosis not present

## 2012-10-29 LAB — BASIC METABOLIC PANEL
BUN: <3 mg/dL — ABNORMAL LOW (ref 6–23)
Calcium: 8.6 mg/dL (ref 8.4–10.5)
Creatinine, Ser: 0.25 mg/dL — ABNORMAL LOW (ref 0.50–1.10)
GFR calc Af Amer: >90 mL/min (ref >90)
GFR calc non Af Amer: >90 mL/min (ref >90)
Glucose, Bld: 124 mg/dL — ABNORMAL HIGH (ref 70–99)
Potassium: 2.8 mEq/L — ABNORMAL LOW (ref 3.5–5.1)

## 2012-10-29 LAB — CBC
HCT: 37.8 % (ref 36.0–46.0)
MCH: 31.6 pg (ref 26.0–34.0)
MCHC: 34.4 g/dL (ref 30.0–36.0)
MCV: 91.7 fL (ref 78.0–100.0)
RDW: 12.2 % (ref 11.5–15.5)

## 2012-10-29 MED ORDER — HEPARIN SODIUM (PORCINE) 5000 UNIT/ML IJ SOLN
5000.0000 [IU] | Freq: Two times a day (BID) | INTRAMUSCULAR | Status: DC
  Administered 2012-10-29 – 2012-11-06 (×16): 5000 [IU] via SUBCUTANEOUS
  Filled 2012-10-29 (×17): qty 1

## 2012-10-29 MED ORDER — ADULT MULTIVITAMIN LIQUID CH
5.0000 mL | Freq: Every day | ORAL | Status: DC
  Administered 2012-10-29 – 2012-11-06 (×9): 5 mL
  Filled 2012-10-29 (×9): qty 5

## 2012-10-29 MED ORDER — FREE WATER
140.0000 mL | Freq: Three times a day (TID) | Status: DC
  Administered 2012-10-29 – 2012-11-06 (×25): 140 mL

## 2012-10-29 MED ORDER — POTASSIUM CHLORIDE CRYS ER 20 MEQ PO TBCR: 40.0000 meq | EXTENDED_RELEASE_TABLET | ORAL | Status: DC

## 2012-10-29 MED ORDER — POTASSIUM CHLORIDE 20 MEQ/15ML (10%) PO LIQD
ORAL | Status: AC
  Administered 2012-10-29: 40 meq
  Filled 2012-10-29: qty 30

## 2012-10-29 MED ORDER — POTASSIUM CHLORIDE 20 MEQ/15ML (10%) PO LIQD
40.0000 meq | ORAL | Status: AC
  Administered 2012-10-29 (×2): 40 meq
  Filled 2012-10-29 (×2): qty 30

## 2012-10-29 MED ORDER — JEVITY 1.2 CAL PO LIQD
237.0000 mL | Freq: Three times a day (TID) | ORAL | Status: DC
  Administered 2012-10-29: 13:00:00
  Administered 2012-10-29 – 2012-11-06 (×24): 237 mL
  Filled 2012-10-29 (×39): qty 237

## 2012-10-29 NOTE — Progress Notes (Signed)
INITIAL ADULT NUTRITION ASSESSMENT Date: 10/29/2012   Time: 10:32 AM Reason for Assessment: Consult received to initiate and manage enteral nutrition support.  INTERVENTION: Start bolus TF with 1 can Jevity 1.2 TID TF regimen will provide: 855 kcal (>100% of needs), 39.6 grams protein (>100% of needs), 573 ml H2O 70 ml H2O flush before and after each bolus feeding Total free water 993 ml MVI daily  DOCUMENTATION CODES Per approved criteria  -Not Applicable    ASSESSMENT: Female 21 y.o.  Dx: Status epilepticus  Hx:  Past Medical History  Diagnosis Date  . Seizures   . Cerebral palsy    Past Surgical History  Procedure Date  . Back surgery   . Hip surgery     bilateral   Related Meds:  Scheduled Meds:   . clonazePAM  0.5 mg Oral TID  . felbamate  360 mg Oral Q24H  . felbamate  480 mg Per Tube Q24H  . felbamate  480 mg Oral Q24H  . heparin  5,000 Units Subcutaneous Q12H  . [EXPIRED] levetiracetam  1,500 mg Intravenous To ER  . levetiracetam  1,500 mg Intravenous Q12H  . [COMPLETED] potassium chloride  40 mEq Per Tube Q1 Hr x 2  . [COMPLETED] potassium chloride      . pregabalin  75 mg Oral Daily  . [DISCONTINUED] felbamate  600 mg Per Tube Q12H  . [DISCONTINUED] heparin  5,000 Units Subcutaneous Q8H  . [DISCONTINUED] potassium chloride  40 mEq Oral Q1 Hr x 2   Continuous Infusions:   . [DISCONTINUED] dextrose 5 % and 0.45% NaCl 50 mL/hr at 10/28/12 0645  . [DISCONTINUED] dextrose 5 % and 0.45% NaCl 75 mL/hr (10/28/12 1400)   PRN Meds:.sodium chloride, ibuprofen, LORazepam  Ht: 3' 10.06" (117 cm)  Wt: 63 lb 4.4 oz (28.7 kg)  Ideal Wt: 29.5-30.9 kg % Ideal Wt: 97%  Usual Wt:  Wt Readings from Last 10 Encounters:  10/29/12 63 lb 4.4 oz (28.7 kg)   % Usual Wt: 100%  Body mass index is 20.97 kg/(m^2). WNL   Food/Nutrition Related Hx: per mom home TF going well  Labs:  CMP     Component Value Date/Time   NA 139 10/29/2012 0545   K 2.8* 10/29/2012  0545   CL 105 10/29/2012 0545   CO2 24 10/29/2012 0545   GLUCOSE 124* 10/29/2012 0545   BUN <3* 10/29/2012 0545   CREATININE 0.25* 10/29/2012 0545   CALCIUM 8.6 10/29/2012 0545   PROT 6.6 04/23/2009 0900   ALBUMIN 3.1* 04/23/2009 0900   AST 21 04/23/2009 0900   ALT 16 04/23/2009 0900   ALKPHOS 206* 04/23/2009 0900   BILITOT 0.4 04/23/2009 0900   GFRNONAA >90 10/29/2012 0545   GFRAA >90 10/29/2012 0545  CBG (last 3)  No results found for this basename: GLUCAP:3 in the last 72 hours Sodium  Date/Time Value Range Status  10/29/2012  5:45 AM 139  135 - 145 mEq/L Final  10/28/2012  4:57 PM 137  135 - 145 mEq/L Final  10/28/2012  4:30 AM 137  135 - 145 mEq/L Final    Potassium  Date/Time Value Range Status  10/29/2012  5:45 AM 2.8* 3.5 - 5.1 mEq/L Final     DELTA CHECK NOTED  10/28/2012  4:57 PM 3.8  3.5 - 5.1 mEq/L Final  10/28/2012  4:30 AM 3.5  3.5 - 5.1 mEq/L Final    Phosphorus  Date/Time Value Range Status  04/23/2009  9:00 AM 3.3  2.3 -  4.6 mg/dL Final    Magnesium  Date/Time Value Range Status  04/23/2009  9:00 AM 2.2  1.5 - 2.5 mg/dL Final    Intake/Output Summary (Last 24 hours) at 10/29/12 1033 Last data filed at 10/29/12 0700  Gross per 24 hour  Intake   1335 ml  Output      0 ml  Net   1335 ml   Diet Order:   NPO  Supplements/Tube Feeding: none  IVF:    [DISCONTINUED] dextrose 5 % and 0.45% NaCl Last Rate: 50 mL/hr at 10/28/12 0645  [DISCONTINUED] dextrose 5 % and 0.45% NaCl Last Rate: 75 mL/hr (10/28/12 1400)   21 y/o with 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 She generally has infrequent seizures. She has been followed by Dr. Sharene Skeans in our practice, on multiple antiepileptic medications with fairly good control in the past few years.  Per notes pt's home TF regimen  is 1 can Jevity 1.0 TID (provides: 750 kcal, 31.2 grams protein, 591 ml H2O) Spoke with mom by phone who reports good tolerance of TF at home. Per chart review pt received PEG back in 2010 after failing swallow eval during hospitalization. At that time pt was 16 kg (35 lb). Per mom pt was put on Jevity 1.5 and gained weight but had a cough. Pt was then put on Jevity 1.0 formula with good tolerance and weight gain. Pt is now 28.7 kg or 63 lb. Per mom her last known weight was 62 lb.    Estimated Nutritional Needs:   Kcal:  161-096 Protein:  31-40 grams Fluid:  1 L/day  NUTRITION DIAGNOSIS: Inadequate oral intake r/t inability to eat AEB NPO status.  MONITORING/EVALUATION(Goals): Goal: Pt to meet >/= 90% of their estimated nutrition needs. Monitor: TF tolerance, weight, labs  EDUCATION NEEDS: -No education needs identified at this time  Kendell Bane RD, LDN, CNSC [phone] Pager [phone] After Hours Pager  10/29/2012, 10:32 AM

## 2012-10-29 NOTE — Progress Notes (Signed)
EEG completed at bedside. EEG recorded for 25 mins after speaking with Dr. Wallene Huh

## 2012-10-29 NOTE — Progress Notes (Signed)
Name: Allison [name] MRN: [id number] DOB: [birth year]    LOS: 2  Referring Provider:  Jimmye Norman, PA Reason for Referral:  Status epilepticus  PULMONARY / CRITICAL CARE MEDICINE  Brief patient description:  21 y/o with CP/MR and seizure disorder admitted in status epilepticus  Lines/tubes:  Culture data  Antibiotics  Level of Care:  ICU Primary Service:  PCCM Consultants:  neurology Code Status:  full Diet:  jevity via tube DVT Px:  Heparin/asa GI Px:  none .   Current Status: RASS 0. Non verbal. Not F/C. EEG completed this AM  Vital Signs: Temp:  [98.5 F (36.9 C)-98.6 F (37 C)] 98.6 F (37 C) (11/04 0700) Pulse Rate:  [66-119] 86  (11/04 1300) Resp:  [4-28] 23  (11/04 1300) BP: (92-122)/(50-74) 106/59 mmHg (11/04 1300) SpO2:  [94 %-100 %] 94 % (11/04 1300) Weight:  [28.7 kg (63 lb 4.4 oz)] 28.7 kg (63 lb 4.4 oz) (11/04 0500)  Physical Examination: General:  NAD Neuro:   left arm contracted, + spont movement, epsiodes of increased tone with tremor - ? seizure HEENT:  WNL Neck:  Supple, no JVD  Cardiovascular: RRR s M  Lungs:  CTAB Abdomen:  Soft, NTND, +BS, G-tube in place  Musculoskeletal:  Left arm contracted at elbow, legs stiff, no edema   BMET    Component Value Date/Time   NA 139 10/29/2012 0545   K 2.8* 10/29/2012 0545   CL 105 10/29/2012 0545   CO2 24 10/29/2012 0545   GLUCOSE 124* 10/29/2012 0545   BUN <3* 10/29/2012 0545   CREATININE 0.25* 10/29/2012 0545   CALCIUM 8.6 10/29/2012 0545   GFRNONAA >90 10/29/2012 0545   GFRAA >90 10/29/2012 0545    CBC    Component Value Date/Time   WBC 7.9 10/29/2012 0545   RBC 4.12 10/29/2012 0545   HGB 13.0 10/29/2012 0545   HCT 37.8 10/29/2012 0545   PLT 166 10/29/2012 0545   MCV 91.7 10/29/2012 0545   MCH 31.6 10/29/2012 0545   MCHC 34.4 10/29/2012 0545   RDW 12.2 10/29/2012 0545   LYMPHSABS 2.2 10/27/2012 0940   MONOABS 0.6 10/27/2012 0940   EOSABS 0.2 10/27/2012 0940   BASOSABS 0.0 10/27/2012 0940     CXR: no new film  Principal Problem:  *Status epilepticus Active Problems:  Seizures  Cerebral palsy Hypokalemia - repleted 11/04 Sinus tachycardia, resolved [place] acidosis - resolved Low grade fever - resolved  PLAN Seizure mgmt per Neuro TFs ordered Heparin dose decreased due to diminutive size IVFs adjusted and K+ repleted  OK to transfer out of ICU from PCCM perspective depending on Neurology plan   Billy Fischer, MD ; [facility] service Mobile [phone].  After 5:30 PM or weekends, call [phone]

## 2012-10-29 NOTE — Progress Notes (Signed)
Patient received from 3100, had had 6 episodes of seizure activity on this floor so ativan 1mg  given as instructed. Seizure activity last about 10 seconds. Will continue to monitor. Vitals stable

## 2012-10-30 DIAGNOSIS — G40401 Other generalized epilepsy and epileptic syndromes, not intractable, with status epilepticus: Secondary | ICD-10-CM

## 2012-10-30 LAB — BASIC METABOLIC PANEL
BUN: 4 mg/dL — ABNORMAL LOW (ref 6–23)
CO2: 21 mEq/L (ref 19–32)
Calcium: 9.1 mg/dL (ref 8.4–10.5)
Creatinine, Ser: 0.23 mg/dL — ABNORMAL LOW (ref 0.50–1.10)
Glucose, Bld: 134 mg/dL — ABNORMAL HIGH (ref 70–99)

## 2012-10-30 MED ORDER — SODIUM CHLORIDE 0.9 % IV SOLN
50.0000 mg | Freq: Two times a day (BID) | INTRAVENOUS | Status: DC
  Administered 2012-10-30: 50 mg via INTRAVENOUS
  Filled 2012-10-30 (×4): qty 5

## 2012-10-30 NOTE — Plan of Care (Signed)
Problem: Phase I Progression Outcomes Goal: OOB as tolerated unless otherwise ordered Outcome: Not Met (add Reason) Pt has CP.

## 2012-10-30 NOTE — Progress Notes (Signed)
Pt having seizures constantly while awake last night. Around 2-3 seizures every 5 minutes. Seizures last  between 10-15 seconds. Pt was able to go to sleep around 12 am and remained asleep throughout the night.  When pt was woken up this morning, pt had another seizure which last around 10 seconds.  Pt then when back to sleep.

## 2012-10-30 NOTE — ED Provider Notes (Signed)
Medical screening examination/treatment/procedure(s) were performed by non-physician practitioner and as supervising physician I was immediately available for consultation/collaboration.   Loren Racer, MD 10/30/12 [phone]

## 2012-10-30 NOTE — Progress Notes (Signed)
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 She was brought to ER with low-grade fever, 101, mild cold symptoms on keppra, flebemate, klonopin and lyrica for seizures.Her EEG yesterday showed 16 events in a 25 minute period. She is having clusters of seizures and with her functional level it is difficult to sometimes tell recovery time.   Subjective:  Patient lying in bed. Does not communicate. Does not appear to be in pain.   Objective: BP 108/60  Pulse 117  Temp 97.8 F (36.6 C) (Axillary)  Resp 18  Ht 3' 10.06" (1.17 m)  Wt 22.5 kg (49 lb 9.7 oz)  BMI 16.44 kg/m2  SpO2 98%  LMP 10/26/2012  CBGs No results found for this basename: GLUCAP:10 in the last 72 hours   Medications: Scheduled:   . clonazePAM  0.5 mg Oral TID  . feeding supplement (JEVITY 1.2 CAL)  237 mL Per Tube TID  . felbamate  360 mg Oral Q24H  . felbamate  480 mg Per Tube Q24H  . felbamate  480 mg Oral Q24H  . free water  140 mL Per Tube TID  . heparin  5,000 Units Subcutaneous Q12H  . levetiracetam  1,500 mg Intravenous Q12H  . multivitamin  5 mL Per Tube Daily  . pregabalin  75 mg Oral Daily    Neurologic Exam: Mental Status: Sleepy. Responds to light. Not talking. Cranial Nerves: II- unable to access III/IV/VI- Pupils reactive bilaterally. Unable to access extraocular movements V/VII-patient unable to cooperate VIII-unable to determine if she recognizes hearing IX/X-did not test Motor: Spastic quadriparesis, increased tone throughout Sensory: unable to test Deep Tendon Reflexes: brisk reflexes on right (UE>LE). She does exhibite sustained ankle clonus L > R Plantars: mute Cerebellar: unable to test   Lab Results: CBC:  Lab 10/29/12 0545 10/28/12 0430 10/27/12 0940  WBC 7.9 9.5 --  NEUTROABS -- -- 4.9  HGB 13.0 12.3 --  HCT  37.8 36.2 --  MCV 91.7 93.1 --  PLT 166 173 --   Basic Metabolic Panel:  Lab 10/30/12 1610 10/29/12 0545  NA 133* 139  K 4.7 2.8*  CL 99 105  CO2 21 24  GLUCOSE 134* 124*  BUN 4* <3*  CREATININE 0.23* 0.25*  CALCIUM 9.1 8.6  MG -- --  PHOS -- --   Assessment/Plan: 21 yo admitted with status epilepticus. Now on therapies: felbamate, keppra, lyrica and klonopin. EEG shows recurrent cluster seizures that appeared generalized. Transferred out of unit to the neuro floor for observation.   Seizures, now in clusters. AEDs per Dr. Sharene Skeans.   Hypokalemia, recheck bmp in am with mg  Hyponatremia, recheck bmp in am   LOS: 3 days    Job Founds, MBA, [facility] Pager [phone]   I have seen and evaluated the patient. I have reviewed the above note and made appropriate changes.   Ritta Slot, MD [facility] [phone]  If 7pm- 7am, please page neurology on call at [phone].

## 2012-10-30 NOTE — Progress Notes (Signed)
Patient had had multiple seizure activity through out the shift. Ativan was not given because it did not meet the criteria for that. Will continue to monitor, mother and sister at bedside

## 2012-10-30 NOTE — Progress Notes (Signed)
Patient ID: Allison [name], female   DOB: [birth year], 21 y.o.   MRN: [id number]  Neurology Hospital Progress Note  Patient name: Allison [name] Medical record number: [id number] Date of birth: June 16, 1991 Age: 21 y.o. Gender: female    LOS: 3 days   Primary Care Provider: No primary provider on file.  Overnight Events:Allison [name] has continued to have seizures lasting 15-30 seconds in duration.  These are stereotyped and begin with and eyelids fluttering, then tonic grimacing of her face to the right and posturing of her right arm and flexion.  Was last for about 10-15 seconds and then is followed by clonic activity of the right arm and sometimes the jaw.  Often her eyelids are open and her eyes are deviated upward, or to either side.  She does not have nystagmoid eye movements. The patient rests in the aftermath.  Her EEG yesterday showed 16 events in a 25 minute period.  It lasted from 22 seconds to 40 seconds in duration.  These were all very similar and showed a tetanic generalized muscle artifact that were the and showed rhythmic 6 Hz spike and wave activity at the same frequency is jerking movements of her arms.  This was not just motion artifact.  In the aftermath background showed low-voltage lower theta or delta range activity that persisted for 30 seconds to 2 minutes until the next electrographic/clinical seizure.  I have not yet been able to speak with her mother.  I don't recall the medications used in the past when she was much younger.  I like chronic records do not extend prior to 2010.  I would like to try either IV lacosamide, or IV Depacon to see if we can bring these under better control.  She is on nearly optimal doses of her oral medications.  Dalphine is functioning at a low level and in my opinion is not going to be injured by these episodes but we have to bring them under control.  I think that she is safe in the neurology area.  I spoke with Dr. Shan Levans.  Yesterday we had  agreed that she would stay in ICU well we tried to bring seizures under control.  I see little utility at this point to a prolonged video EEG as the seizures are obvious and they correlate exactly with the electrographic changes on EEG.  Objective: Vital signs in last 24 hours: Temp:  [97.4 F (36.3 C)-98.6 F (37 C)] 97.4 F (36.3 C) (11/05 0556) Pulse Rate:  [56-109] 56  (11/05 0556) Resp:  [16-23] 16  (11/05 0556) BP: (87-109)/(53-74) 87/59 mmHg (11/05 0556) SpO2:  [94 %-100 %] 100 % (11/05 0556) Weight:  [22.5 kg (49 lb 9.7 oz)] 22.5 kg (49 lb 9.7 oz) (11/05 0556)  Wt Readings from Last 3 Encounters:  10/30/12 22.5 kg (49 lb 9.7 oz)    Intake/Output Summary (Last 24 hours) at 10/30/12 1154 Last data filed at 10/30/12 0758  Gross per 24 hour  Intake    842 ml  Output      0 ml  Net    842 ml    Current Facility-Administered Medications  Medication Dose Route Frequency Provider Last Rate Last Dose  . 0.9 %  sodium chloride infusion  250 mL Intravenous PRN Carolan Clines, MD      . clonazePAM Scarlette Calico) tablet 0.5 mg  0.5 mg Oral TID Carolan Clines, MD   0.5 mg at 10/30/12 1025  . feeding supplement (JEVITY 1.2 CAL) liquid  237 mL  237 mL Per Tube TID Heather Cornelison Pitts, RD   237 mL at 10/30/12 0803  . felbamate (FELBATOL) 600 MG/5ML suspension 360 mg  360 mg Oral Q24H Nelda Bucks, MD   360 mg at 10/29/12 1542  . felbamate (FELBATOL) 600 MG/5ML suspension 480 mg  480 mg Per Tube Q24H Nelda Bucks, MD   480 mg at 10/29/12 2315  . felbamate (FELBATOL) 600 MG/5ML suspension 480 mg  480 mg Oral Q24H Nelda Bucks, MD   480 mg at 10/30/12 0804  . free water 140 mL  140 mL Per Tube TID Heather Cornelison Pitts, RD   140 mL at 10/30/12 0804  . heparin injection 5,000 Units  5,000 Units Subcutaneous Q12H Merwyn Katos, MD   5,000 Units at 10/30/12 1025  . ibuprofen (ADVIL,MOTRIN) 100 MG/5ML suspension 200 mg  200 mg Oral Q4H PRN Carolan Clines, MD       . levETIRAcetam (KEPPRA) 1,500 mg in sodium chloride 0.9 % 100 mL IVPB  1,500 mg Intravenous Q12H Carolan Clines, MD   1,500 mg at 10/30/12 0804  . LORazepam (ATIVAN) injection 1 mg  1 mg Intravenous Q1H PRN Carolan Clines, MD   1 mg at 10/29/12 1639  . multivitamin liquid 5 mL  5 mL Per Tube Daily Heather Cornelison Pitts, RD   5 mL at 10/30/12 1024  . pregabalin (LYRICA) capsule 75 mg  75 mg Oral Daily Carolan Clines, MD   75 mg at 10/29/12 2006   PE: Gen:  Microcephaly, extremely sleepy, takes little note of the examinee HEENT:  No signs of infection CV:  No murmurs, pulse is normal Res:  Lungs clear to auscultation Abd:  Soft bowel sounds normal percutaneous gastrostomy tube in the left lower quadrant Ext/Musc:  Spastic contractures of the arms and legs, with greater weakness and spasticity in the left Neuro:  Sleepy, does not respond to voice, nor does she appear to be aware of the examiner Round reactive pupils, positive red reflex.  Her eyes are moving too much to assess her fundi. Symmetric facial strength Spastic quadriparesis left greater than right with some preserved strength in the right arm and leg and significant weakness and spasticity on the left.  Withdrawal x2 on the right, but not the left He can reflexes show brisk reflexes left greater than right and sustained ankle clonus on the left with a left extensor right equivocal plantar response.  Assessment/Plan:  [name] is having clusters of seizures.  She actually has periods of time in between her seizures, and dysfunction and such a low level is not possible to know whether there is recovery between.  She is not having continuous seizure activity.  This is not significantly affecting for hemodynamically or her oxygen saturation.  I need to speak with her mother about treatment options.  On going to recommend either IV lacosamide, her IV Depacon.  I am certain that she must of been on valproic acid in the past,  but I don't know how she tolerated it.  She will continue her other medications for now.  Mother's telephone number is [phone].  Her name is [name].  SignedDeetta Perla, MD Child neurology attending [phone] 10/30/2012 11:54 AM

## 2012-10-30 NOTE — Procedures (Signed)
EEG NUMBER:  13 - [id number].  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  The patient has experienced explosive increase in seizures that are occurring once every 1-2 minutes lasting 20-40 seconds in duration, associated with an initial flutter of eyelids, followed by closing her eyes, clenching of her face and tightening of her body, clonic activity of the right upper extremity, opening her eyes with deviation of the eyes to 1 side or the other, followed by rhythmic movements of her mouth with some thrusting of her tongue.  These are stereotyped repetitive.  The patient has been treated with her baseline medications which include clonazepam, felbamate, levetiracetam, and Lyrica.  She has also received additional doses of those medications without change in seizure frequency.  The patient had low-grade fever with normal basic metabolic panel, CBC with differential, and chest x-ray, and urinalysis which failed to show source of the fever.(345.11)  PROCEDURE:  The tracing is carried out on a 32-channel digital Cadwell recorder, reformatted into 16-channel montages with 1 devoted to EKG. The patient was awake, but poorly responsive.  Sixteen episodes were reported that were stereotyped.  At the beginning of the record, the patient is in the midst of a seizure that is characterized electrographically high voltage muscle artifact that obscures the background in all, leaving a low voltage rhythmic lower theta, upper delta range activity of under 15 microvolts.  During some of the episodes, the tympanic activity shows 5-6 Hz spike and slow wave discharge toward the end of the seizure that is associated with jerking movements of the arm and of the face.  This does not appear to be artifactual.  The  episodes have a characteristic appearance on EEG that is similar from 1 episode to the other with durations ranging from 22 seconds to 40 seconds without significant changes and clinical semi- allergy or background ictal activity.  Postictal periods are relatively brief lasting anywhere from 20 seconds to 2 minutes.  Altogether over 25- 25 minutes record 16 episodes occurred.  There was no focal slowing.  There was no interictal activity between electrographic seizures.  EKG showed regular sinus rhythm with ventricular response of 90 beats per minute.  IMPRESSION:  Profoundly abnormal EEG on the basis of recurrent cluster seizures that appeared generalized in nature as described above.  There is both clinical and electrographic correlation with these behaviors.     Deanna Artis. Sharene Skeans, M.D.    HYQ:MVHQ D:  10/30/2012 06:18:58  T:  10/30/2012 09:07:52  Job #:  [id number]

## 2012-10-30 NOTE — Progress Notes (Signed)
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   Current Status: RASS 0. Non verbal. Not F/C.Intermittent seizure activity still noted   Vital Signs: Temp:  [97.4 F (36.3 C)-98.6 F (37 C)] 97.4 F (36.3 C) (11/05 0556) Pulse Rate:  [56-109] 56  (11/05 0556) Resp:  [16-24] 16  (11/05 0556) BP: (87-112)/(53-74) 87/59 mmHg (11/05 0556) SpO2:  [94 %-100 %] 100 % (11/05 0556) Weight:  [22.5 kg (49 lb 9.7 oz)] 22.5 kg (49 lb 9.7 oz) (11/05 0556)  Physical Examination: General:  NAD Neuro:   left arm contracted, + spont movement, epsiodes of increased tone with tremor - prob seizure HEENT:  WNL Neck:  Supple, no JVD  Cardiovascular: RRR s M  Lungs:  CTAB Abdomen:  Soft, NTND, +BS, G-tube in place  Musculoskeletal:  Left arm contracted at elbow, legs stiff, no edema   BMET    Component Value Date/Time   NA 139 10/29/2012 0545   K 2.8* 10/29/2012 0545   CL 105 10/29/2012 0545   CO2 24 10/29/2012 0545   GLUCOSE 124* 10/29/2012 0545   BUN <3* 10/29/2012 0545   CREATININE 0.25* 10/29/2012 0545   CALCIUM 8.6 10/29/2012 0545   GFRNONAA >90 10/29/2012 0545   GFRAA >90 10/29/2012 0545    CBC    Component Value Date/Time   WBC 7.9 10/29/2012 0545   RBC 4.12 10/29/2012 0545   HGB 13.0 10/29/2012 0545   HCT 37.8 10/29/2012 0545   PLT 166 10/29/2012 0545   MCV 91.7 10/29/2012 0545   MCH 31.6 10/29/2012 0545   MCHC 34.4 10/29/2012 0545   RDW 12.2 10/29/2012 0545   LYMPHSABS 2.2 10/27/2012 0940   MONOABS 0.6 10/27/2012 0940   EOSABS 0.2 10/27/2012  0940   BASOSABS 0.0 10/27/2012 0940    CXR: no new film  Principal Problem:  *Status epilepticus Active Problems:  Seizures  Cerebral palsy  Hypokalemia Hypokalemia - repleted 11/04>>f/u bmet result 11/5   PLAN Seizure mgmt per Neuro>>Peds Neuro does not have adult privileges.  Will ask neurohosp svc to assume primary care  Cont TFs Check bmet 11/5 for K level repeat Repeat bmet am 11/6   Dorcas Carrow Beeper  [phone]  Cell  [phone]  If no response or cell goes to voicemail, call beeper [phone]

## 2012-10-31 LAB — BASIC METABOLIC PANEL
BUN: 7 mg/dL (ref 6–23)
Calcium: 9.1 mg/dL (ref 8.4–10.5)
Creatinine, Ser: 0.31 mg/dL — ABNORMAL LOW (ref 0.50–1.10)
GFR calc Af Amer: >90 mL/min (ref >90)
Glucose, Bld: 97 mg/dL (ref 70–99)
Potassium: 4.1 mEq/L (ref 3.5–5.1)

## 2012-10-31 LAB — MAGNESIUM: Magnesium: 1.9 mg/dL (ref 1.5–2.5)

## 2012-10-31 LAB — TSH: TSH: 2.08 u[IU]/mL (ref 0.350–4.500)

## 2012-10-31 MED ORDER — SODIUM CHLORIDE 0.9 % IV SOLN
50.0000 mg | Freq: Once | INTRAVENOUS | Status: AC
  Administered 2012-10-31: 50 mg via INTRAVENOUS
  Filled 2012-10-31 (×2): qty 5

## 2012-10-31 MED ORDER — SODIUM CHLORIDE 0.9 % IV SOLN
50.0000 mg | Freq: Four times a day (QID) | INTRAVENOUS | Status: DC
  Administered 2012-10-31 – 2012-11-01 (×4): 50 mg via INTRAVENOUS
  Filled 2012-10-31 (×7): qty 5

## 2012-10-31 NOTE — Progress Notes (Signed)
Subjective: Patient continues to have multiple seizures.  On Klonopin, Felbamate, Vimpat and Keppra.  Vimpat started on yesterday at 50mg  QID.  Has only had one dose.  No bolus given.    Objective: Current vital signs: BP 110/48  Pulse 78  Temp 98.4 F (36.9 C) (Axillary)  Resp 16  Ht 3' 10.06" (1.17 m)  Wt 22.6 kg (49 lb 13.2 oz)  BMI 16.51 kg/m2  SpO2 97%  LMP 10/26/2012 Vital signs in last 24 hours: Temp:  [97.8 F (36.6 C)-98.6 F (37 C)] 98.4 F (36.9 C) (11/06 0600) Pulse Rate:  [78-117] 78  (11/06 0600) Resp:  [16-18] 16  (11/06 0600) BP: (108-115)/(48-77) 110/48 mmHg (11/06 0600) SpO2:  [97 %-98 %] 97 % (11/06 0600) Weight:  [22.6 kg (49 lb 13.2 oz)] 22.6 kg (49 lb 13.2 oz) (11/06 0600)  Intake/Output from previous day: 11/05 0701 - 11/06 0700 In: 230  Out: -  Intake/Output this shift: Total I/O In: 230 [Other:230] Out: -  Nutritional status:    Neurologic Exam: Mental Status:  Sleepy. No speech noted Cranial Nerves:  II- unable to perform  III/IV/VI- Pupils reactive bilaterally.  V/VII-corneals bilaterally  VIII-unable to perform  IX/X-not tested XI-unable to perform  Motor: Spastic quadriparesis, increased tone throughout (L>R) Sensory: unable to test  Deep Tendon Reflexes: brisk reflexes on throughout Plantars: mute  Cerebellar: unable to test    Lab Results: Basic Metabolic Panel:  Lab 10/31/12 6962 10/30/12 0900 10/29/12 0545 10/28/12 1657 10/28/12 0430  NA 140 133* 139 137 137  K 4.1 4.7 2.8* 3.8 3.5  CL 105 99 105 106 106  CO2 26 21 24 20  17*  GLUCOSE 97 134* 124* 98 74  BUN 7 4* <3* 4* 7  CREATININE 0.31* 0.23* 0.25* 0.28* 0.29*  CALCIUM 9.1 9.1 8.6 -- --  MG 1.9 -- -- -- --  PHOS -- -- -- -- --    Liver Function Tests: No results found for this basename: AST:5,ALT:5,ALKPHOS:5,BILITOT:5,PROT:5,ALBUMIN:5 in the last 168 hours No results found for this basename: LIPASE:5,AMYLASE:5 in the last 168 hours No results found for this  basename: AMMONIA:3 in the last 168 hours  CBC:  Lab 10/29/12 0545 10/28/12 0430 10/27/12 0940  WBC 7.9 9.5 8.0  NEUTROABS -- -- 4.9  HGB 13.0 12.3 14.5  HCT 37.8 36.2 41.8  MCV 91.7 93.1 92.1  PLT 166 173 168    Cardiac Enzymes: No results found for this basename: CKTOTAL:5,CKMB:5,CKMBINDEX:5,TROPONINI:5 in the last 168 hours  Lipid Panel: No results found for this basename: CHOL:5,TRIG:5,HDL:5,CHOLHDL:5,VLDL:5,LDLCALC:5 in the last 168 hours  CBG: No results found for this basename: GLUCAP:5 in the last 168 hours  Microbiology: Results for orders placed during the hospital encounter of 10/27/12  MRSA PCR SCREENING     Status: Normal   Collection Time   10/27/12 10:31 PM      Component Value Range Status Comment   MRSA by PCR NEGATIVE  NEGATIVE Final     Coagulation Studies: No results found for this basename: LABPROT:5,INR:5 in the last 72 hours  Imaging: No results found.  Medications:  I have reviewed the patient's current medications. Scheduled:   . clonazePAM  0.5 mg Oral TID  . feeding supplement (JEVITY 1.2 CAL)  237 mL Per Tube TID  . felbamate  360 mg Oral Q24H  . felbamate  480 mg Per Tube Q24H  . felbamate  480 mg Oral Q24H  . free water  140 mL Per Tube TID  .  heparin  5,000 Units Subcutaneous Q12H  . lacosamide (VIMPAT) IV  50 mg Intravenous Q6H  . levetiracetam  1,500 mg Intravenous Q12H  . multivitamin  5 mL Per Tube Daily  . pregabalin  75 mg Oral Daily  . [DISCONTINUED] lacosamide (VIMPAT) IV  50 mg Intravenous Q12H    Assessment/Plan:  Patient Active Hospital Problem List: Status epilepticus (10/27/2012)   Assessment: Patient continues to have frequent seizure activity despite four anticonvulsant medications.  Has only has one dose of Vimpat.  Will give a small load and continue maintenance to see if this will be helpful.     Plan: 1.  Patient due for a dose of Vimpat now.  Will give an additional 50mg  for a total of 100mg  and will continue  maintenance of 50mg  Q6.      LOS: 4 days   Thana Farr, MD [facility] [phone] 10/31/2012  9:56 AM

## 2012-10-31 NOTE — Progress Notes (Signed)
10/31/12 Pt having multiple episodes of seizure activity within the span of minutes. Each episode lasting around 10-15 seconds. Ativan given.

## 2012-10-31 NOTE — Progress Notes (Signed)
Patient had multiple episodes of seizure activity which lasted for 10-15 sec throughout the night. Ativan given twice, See MAR. Will continue to monitor.

## 2012-10-31 NOTE — Progress Notes (Signed)
Patient ID: Allison [name], female   DOB: 10-29-1991, 21 y.o.   MRN: [id number] [facility] Note  Patient name: Allison [name] Medical record number: [id number] Date of birth: 01-11-1991 Age: 21 y.o. Gender: female    LOS: 4 days   Primary Care Provider: No primary provider on file.  Overnight Events:   Allison [name] continues to have frequent seizures.  She is averaging about 30 seizures in our his last for 20-30 seconds and are unchanged.  They involve grimacing her face with effaced onto the right, a tonic posturing followed by clonic activity of the right arm.  She was somewhat more alert today with her eyes open.  Was not convinced that she could fix and follow.  I spoke with her mother and conveyed my concern that we have no clear etiology for her seizures.  I decided to increase the dose of Vimpat this morning.  She only received one dose, and because of her polypharmacy is reluctant to low with Vimpat.  Objective: Vital signs in last 24 hours: Temp:  [98.4 F (36.9 C)-98.7 F (37.1 C)] 98.7 F (37.1 C) (11/06 1441) Pulse Rate:  [78-107] 98  (11/06 1441) Resp:  [16-17] 17  (11/06 1441) BP: (87-115)/(47-77) 106/63 mmHg (11/06 1449) SpO2:  [97 %-100 %] 100 % (11/06 1441) Weight:  [22.6 kg (49 lb 13.2 oz)] 22.6 kg (49 lb 13.2 oz) (11/06 0600)  Wt Readings from Last 3 Encounters:  10/31/12 22.6 kg (49 lb 13.2 oz)    Intake/Output Summary (Last 24 hours) at 10/31/12 2146 Last data filed at 10/31/12 1400  Gross per 24 hour  Intake    460 ml  Output      0 ml  Net    460 ml    Current Facility-Administered Medications  Medication Dose Route Frequency Provider Last Rate Last Dose  . 0.9 %  sodium chloride infusion  250 mL Intravenous PRN Carolan Clines, MD      . clonazePAM Scarlette Calico) tablet 0.5 mg  0.5 mg Oral TID Carolan Clines, MD   0.5 mg at 10/31/12 1623  . feeding supplement (JEVITY 1.2 CAL) liquid 237 mL  237 mL Per Tube TID  Heather Cornelison Pitts, RD   237 mL at 10/31/12 1749  . felbamate (FELBATOL) 600 MG/5ML suspension 360 mg  360 mg Oral Q24H Nelda Bucks, MD   360 mg at 10/31/12 1623  . felbamate (FELBATOL) 600 MG/5ML suspension 480 mg  480 mg Per Tube Q24H Nelda Bucks, MD   480 mg at 10/31/12 0001  . felbamate (FELBATOL) 600 MG/5ML suspension 480 mg  480 mg Oral Q24H Nelda Bucks, MD   480 mg at 10/31/12 [phone]  . free water 140 mL  140 mL Per Tube TID Heather Cornelison Pitts, RD   140 mL at 10/31/12 1800  . heparin injection 5,000 Units  5,000 Units Subcutaneous Q12H Merwyn Katos, MD   5,000 Units at 10/31/12 [phone]  . ibuprofen (ADVIL,MOTRIN) 100 MG/5ML suspension 200 mg  200 mg Oral Q4H PRN Carolan Clines, MD      . lacosamide (VIMPAT) 50 mg in sodium chloride 0.9 % 25 mL IVPB  50 mg Intravenous Q6H Deetta Perla, MD   50 mg at 10/31/12 1643  . [COMPLETED] lacosamide (VIMPAT) 50 mg in sodium chloride 0.9 % 25 mL IVPB  50 mg Intravenous Once Thana Farr, MD   50 mg at 10/31/12 1239  . levETIRAcetam (KEPPRA) 1,500  mg in sodium chloride 0.9 % 100 mL IVPB  1,500 mg Intravenous Q12H Carolan Clines, MD   1,500 mg at 10/31/12 1926  . LORazepam (ATIVAN) injection 1 mg  1 mg Intravenous Q1H PRN Carolan Clines, MD   1 mg at 10/31/12 0744  . multivitamin liquid 5 mL  5 mL Per Tube Daily Heather Cornelison Pitts, RD   5 mL at 10/31/12 0938  . pregabalin (LYRICA) capsule 75 mg  75 mg Oral Daily Carolan Clines, MD   75 mg at 10/31/12 1928  . [DISCONTINUED] lacosamide (VIMPAT) 50 mg in sodium chloride 0.9 % 25 mL IVPB  50 mg Intravenous Q12H Deetta Perla, MD   50 mg at 10/30/12 2159   PE: Gen: Lethargic, opens eyes spontaneously HEENT:No signs of infection CV:No murmurs, pulses normal AVW:UJWJX clear BJY:NWGNF sounds normal no hepatosplenomegaly Ext/Musc:Severe flexion contractures at the elbows wrists, knees, and ankles Neuro:Lethargic, poorly responsive to external  stimuli Sluggishly reactive pupils, does not blink to threat Symmetric facial strength except during seizures Left plegia, right hemiparesis, both spastic, no fine motor movements Withdrawal noxious stimuli x4 Brisk reflexes with left ankle clonus, equivocal plantar responses Labs/Studies:  Assessment/Plan: Vimpat was increased to 50 mg 4 times daily. I made no changes in other medications. I will continue to aggressively push Vimpat upwards as the patient is able to tolerate it. Other options include Depacon.  Her mother recalls that that medication was somewhat sensitive to her.  This was years ago.  SignedDeetta Perla, MD Child neurology attending [phone] 10/31/2012 9:46 PM

## 2012-11-01 MED ORDER — SODIUM CHLORIDE 0.9 % IV SOLN
75.0000 mg | Freq: Four times a day (QID) | INTRAVENOUS | Status: DC
  Administered 2012-11-01 – 2012-11-02 (×4): 75 mg via INTRAVENOUS
  Filled 2012-11-01 (×10): qty 7.5

## 2012-11-01 NOTE — Progress Notes (Signed)
Patient ID: Allison [name], female   DOB: 13-Mar-1991, 21 y.o.   MRN: [id number]  Neurology Hospital Progress Note  Patient name: Allison [name] Medical record number: [id number] Date of birth: 1991/06/22 Age: 21 y.o. Gender: female    LOS: 5 days   Primary Care Provider: No primary provider on file.  Overnight Events: Pietra continues to have frequent seizures.  I witnessed an episode today where there was clear secondary generalization involving the left arm and left face as well as the right side.  The episodes continued to last 20-30 seconds in duration and have periods of 1 minute to 3 minutes in between.  Nursing says that when she sleeps, the episodes it seems somewhat further apart, but she is being arouse from sleep by her seizures.  Vimpat has been started by me, and slowly titrated upward by Dr. Thana Farr.  We're within the titration.  When it would be reasonable to expect that lacosamide has not reached a steady state.  She remains afebrile, showing no constitutional signs or symptoms of illness.  CBC and basic metabolic panels have been stable and normal.  Objective: Vital signs in last 24 hours: Temp:  [97.8 F (36.6 C)-98.7 F (37.1 C)] 97.8 F (36.6 C) (11/07 0600) Pulse Rate:  [88-98] 88  (11/07 0600) Resp:  [17-18] 18  (11/07 0600) BP: (87-128)/(47-83) 128/83 mmHg (11/07 0600) SpO2:  [96 %-100 %] 97 % (11/07 0600) Weight:  [23.4 kg (51 lb 9.4 oz)-24 kg (52 lb 14.6 oz)] 24 kg (52 lb 14.6 oz) (11/07 0500)  Wt Readings from Last 3 Encounters:  11/01/12 24 kg (52 lb 14.6 oz)    Intake/Output Summary (Last 24 hours) at 11/01/12 0840 Last data filed at 11/01/12 4132  Gross per 24 hour  Intake    870 ml  Output      0 ml  Net    870 ml    Current Facility-Administered Medications  Medication Dose Route Frequency Provider Last Rate Last Dose  . 0.9 %  sodium chloride infusion  250 mL Intravenous PRN Carolan Clines, MD      . clonazePAM Scarlette Calico) tablet  0.5 mg  0.5 mg Oral TID Carolan Clines, MD   0.5 mg at 10/31/12 2226  . feeding supplement (JEVITY 1.2 CAL) liquid 237 mL  237 mL Per Tube TID Heather Cornelison Pitts, RD   237 mL at 11/01/12 0753  . felbamate (FELBATOL) 600 MG/5ML suspension 360 mg  360 mg Oral Q24H Nelda Bucks, MD   360 mg at 10/31/12 1623  . felbamate (FELBATOL) 600 MG/5ML suspension 480 mg  480 mg Per Tube Q24H Nelda Bucks, MD   480 mg at 11/01/12 0005  . felbamate (FELBATOL) 600 MG/5ML suspension 480 mg  480 mg Oral Q24H Nelda Bucks, MD   480 mg at 11/01/12 0753  . free water 140 mL  140 mL Per Tube TID Heather Cornelison Pitts, RD   140 mL at 11/01/12 0800  . heparin injection 5,000 Units  5,000 Units Subcutaneous Q12H Merwyn Katos, MD   5,000 Units at 10/31/12 2226  . ibuprofen (ADVIL,MOTRIN) 100 MG/5ML suspension 200 mg  200 mg Oral Q4H PRN Carolan Clines, MD      . [address] lacosamide (VIMPAT) 50 mg in sodium chloride 0.9 % 25 mL IVPB  50 mg Intravenous Once Thana Farr, MD   50 mg at 10/31/12 1239  . lacosamide (VIMPAT) 75 mg in sodium chloride 0.9 % 25  mL IVPB  75 mg Intravenous Q6H Thana Farr, MD      . levETIRAcetam (KEPPRA) 1,500 mg in sodium chloride 0.9 % 100 mL IVPB  1,500 mg Intravenous Q12H Carolan Clines, MD   1,500 mg at 11/01/12 0752  . LORazepam (ATIVAN) injection 1 mg  1 mg Intravenous Q1H PRN Carolan Clines, MD   1 mg at 10/31/12 0744  . multivitamin liquid 5 mL  5 mL Per Tube Daily Heather Cornelison Pitts, RD   5 mL at 10/31/12 0938  . pregabalin (LYRICA) capsule 75 mg  75 mg Oral Daily Carolan Clines, MD   75 mg at 10/31/12 1928  . [DISCONTINUED] lacosamide (VIMPAT) 50 mg in sodium chloride 0.9 % 25 mL IVPB  50 mg Intravenous Q6H Deetta Perla, MD   50 mg at 11/01/12 0442   PE: WUJ:WJXBJ are periods of time the patient is more awake, occasionally smiling, but not fixing or following. 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  Only the elbows can be extended to full range. Diminished reflexes except sustained ankle clonus bilaterally, the patient does not show extensor plantars today.  Labs/Studies: See Dr. Thad Ranger note from today.  Assessment/Plan: Continue to titrate Vimpat upward.  I agree with Dr. Thad Ranger plans.  I discussed the patient's condition today with her mother and answered her questions.  This is a difficult situation.  I do not want to have her receive Ativan unless she has status epilepticus.  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  At some point, we may need to consider pentobarbital , they'll required transfer back to ICU, continuous monitoring of her EEG, and intubation.We will have to carefully work this out with EEG lab work transfer her to a facility that is able to perform this activity 7 days a week.  Mother agrees that we should continue with this plan for now.  SignedDeetta Perla, MD Child neurology attending [phone] 11/01/2012 8:40 AM

## 2012-11-01 NOTE — Progress Notes (Signed)
Nutrition Follow-up  Intervention:   Continue 1 can Jevity 1.2 TID with 70 ml H2O before and after each bolus feeding. 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 She generally has infrequent seizures. She has been followed by Dr. Sharene Skeans in our practice, on multiple antiepileptic medications with fairly good control in the past few years. Per MD notes today pt is still having seizures, MD following.  Per notes pt's home TF regimen is 1 can Jevity 1.0 TID (provides: 750 kcal, 31.2 grams protein, 591 ml H2O)  Spoke with mom by phone who reports good tolerance of TF at home. Per chart review pt received PEG back in 2010 after failing swallow eval during hospitalization. At that time pt was 16 kg (35 lb). Per mom pt was put on Jevity 1.5 and gained weight but had a cough. Pt was then put on Jevity 1.0 formula with good tolerance and weight gain. Pt is now 28.7 kg or 63 lb. Per mom her last known weight was 62 lb.   Question if wt is trending down vs different bed scale. Pt is currently receiving more kcals/protein that she does at home.   Diet Order:  NPO  Meds: Scheduled Meds:   . clonazePAM  0.5 mg Oral TID  . feeding supplement (JEVITY 1.2 CAL)  237 mL Per Tube TID  . felbamate  360 mg Oral Q24H  . felbamate  480 mg Per Tube Q24H  . felbamate  480 mg Oral Q24H  . free water  140 mL Per Tube TID  . heparin  5,000 Units Subcutaneous Q12H  . lacosamide (VIMPAT) IV  75 mg Intravenous Q6H  . levetiracetam  1,500 mg Intravenous Q12H  . multivitamin  5 mL Per Tube Daily  . pregabalin  75 mg Oral Daily  . [DISCONTINUED]  lacosamide (VIMPAT) IV  50 mg Intravenous Q6H   Continuous Infusions:  PRN Meds:.sodium chloride, ibuprofen, LORazepam   CMP     Component Value Date/Time   NA 140 10/31/2012 0550   K 4.1 10/31/2012 0550   CL 105 10/31/2012 0550   CO2 26 10/31/2012 0550   GLUCOSE 97 10/31/2012 0550   BUN 7 10/31/2012 0550   CREATININE 0.31* 10/31/2012 0550   CALCIUM 9.1 10/31/2012 0550   PROT 6.6 04/23/2009 0900   ALBUMIN 3.1* 04/23/2009 0900   AST 21 04/23/2009 0900   ALT 16 04/23/2009 0900   ALKPHOS 206* 04/23/2009 0900   BILITOT 0.4 04/23/2009 0900   GFRNONAA >90 10/31/2012 0550   GFRAA >90 10/31/2012 0550   CBG (last 3)  No results found for this basename: GLUCAP:3 in the last 72 hours Sodium  Date/Time Value Range Status  10/31/2012  5:50 AM 140  135 - 145 mEq/L Final     DELTA CHECK NOTED  10/30/2012  9:00 AM 133* 135 - 145 mEq/L Final  10/29/2012  5:45 AM 139  135 - 145 mEq/L Final    Potassium  Date/Time Value Range Status  10/31/2012  5:50 AM 4.1  3.5 - 5.1 mEq/L Final  10/30/2012  9:00 AM 4.7  3.5 - 5.1 mEq/L Final     DELTA CHECK NOTED  10/29/2012  5:45 AM 2.8* 3.5 - 5.1 mEq/L Final     DELTA CHECK NOTED    Phosphorus  Date/Time Value Range Status  04/23/2009  9:00 AM 3.3  2.3 - 4.6 mg/dL Final    Magnesium  Date/Time Value Range Status  10/31/2012  5:50 AM 1.9  1.5 - 2.5 mg/dL Final  1/61/0960  4:54 AM 2.2  1.5 - 2.5 mg/dL Final    Intake/Output Summary (Last 24 hours) at 11/01/12 1542 Last data filed at 11/01/12 0981  Gross per 24 hour  Intake    640 ml  Output      0 ml  Net    640 ml    Weight Status:  63 lb on admission 11/2 (this is usual wt per mom) 52 lb 11/7 51 lb 11/6 49 lb 11/6  Estimated needs:  Kcal: 191-478; Protein: 31-40 grams  Nutrition Dx:  Inadequate oral intake r/t inability to eat AEB NPO status; ongoing.  Goal: Pt to meet >/= 90% of their estimated nutrition needs; met  Monitor:  TF tolerance, weight, labs  Kendell Bane RD, LDN, CNSC [phone]  Pager [phone] After Hours Pager

## 2012-11-01 NOTE — Progress Notes (Signed)
Patient continues to have multiple seizure activity which last for 10-15 seconds except when asleep. No ativan given. Will continue to monitor the patient.

## 2012-11-01 NOTE — Progress Notes (Signed)
Subjective: Patient continues to have frequent seizures although per report of nursing they do seem less frequent than initially.  Had a bolus of Vimpat yesterday and now at 50mg  Q6hrs.  Seems to be tolerating well.  Objective: Current vital signs: BP 128/83  Pulse 88  Temp 97.8 F (36.6 C) (Axillary)  Resp 18  Ht 3' 10.06" (1.17 m)  Wt 24 kg (52 lb 14.6 oz)  BMI 17.53 kg/m2  SpO2 97%  LMP 10/26/2012 Vital signs in last 24 hours: Temp:  [97.8 F (36.6 C)-98.7 F (37.1 C)] 97.8 F (36.6 C) (11/07 0600) Pulse Rate:  [88-98] 88  (11/07 0600) Resp:  [17-18] 18  (11/07 0600) BP: (87-128)/(47-83) 128/83 mmHg (11/07 0600) SpO2:  [96 %-100 %] 97 % (11/07 0600) Weight:  [23.4 kg (51 lb 9.4 oz)-24 kg (52 lb 14.6 oz)] 24 kg (52 lb 14.6 oz) (11/07 0500)  Intake/Output from previous day: 11/06 0701 - 11/07 0700 In: 1100 [IV Piggyback:580] Out: -  Intake/Output this shift:   Nutritional status:    Neurologic Exam: Mental Status:  Awake and alert. No speech noted.  Does not follow commands. Cranial Nerves:  II- unable to perform  III/IV/VI- Pupils reactive bilaterally.  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)  Sensory: not tested Deep Tendon Reflexes: brisk reflexes throughout  Plantars: mute  Cerebellar: unable to test    Lab Results: Basic Metabolic Panel:  Lab 10/31/12 9562 10/30/12 0900 10/29/12 0545 10/28/12 1657 10/28/12 0430  NA 140 133* 139 137 137  K 4.1 4.7 2.8* 3.8 3.5  CL 105 99 105 106 106  CO2 26 21 24 20  17*  GLUCOSE 97 134* 124* 98 74  BUN 7 4* <3* 4* 7  CREATININE 0.31* 0.23* 0.25* 0.28* 0.29*  CALCIUM 9.1 9.1 8.6 -- --  MG 1.9 -- -- -- --  PHOS -- -- -- -- --    Liver Function Tests: No results found for this basename: AST:5,ALT:5,ALKPHOS:5,BILITOT:5,PROT:5,ALBUMIN:5 in the last 168 hours No results found for this basename:  LIPASE:5,AMYLASE:5 in the last 168 hours No results found for this basename: AMMONIA:3 in the last 168 hours  CBC:  Lab 10/29/12 0545 10/28/12 0430 10/27/12 0940  WBC 7.9 9.5 8.0  NEUTROABS -- -- 4.9  HGB 13.0 12.3 14.5  HCT 37.8 36.2 41.8  MCV 91.7 93.1 92.1  PLT 166 173 168    Cardiac Enzymes: No results found for this basename: CKTOTAL:5,CKMB:5,CKMBINDEX:5,TROPONINI:5 in the last 168 hours  Lipid Panel: No results found for this basename: CHOL:5,TRIG:5,HDL:5,CHOLHDL:5,VLDL:5,LDLCALC:5 in the last 168 hours  CBG: No results found for this basename: GLUCAP:5 in the last 168 hours  Microbiology: Results for orders placed during the hospital encounter of 10/27/12  MRSA PCR SCREENING     Status: Normal   Collection Time   10/27/12 10:31 PM      Component Value Range Status Comment   MRSA by PCR NEGATIVE  NEGATIVE Final     Coagulation Studies: No results found for this basename: LABPROT:5,INR:5 in the last 72 hours  Imaging: No results found.  Medications:  I have reviewed the patient's current medications. Scheduled:   . clonazePAM  0.5 mg Oral TID  . feeding supplement (JEVITY 1.2 CAL)  237 mL Per Tube TID  . felbamate  360 mg Oral Q24H  . felbamate  480 mg Per Tube Q24H  . felbamate  480 mg Oral Q24H  .  free water  140 mL Per Tube TID  . heparin  5,000 Units Subcutaneous Q12H  . lacosamide (VIMPAT) IV  50 mg Intravenous Q6H  . [COMPLETED] lacosamide (VIMPAT) IV  50 mg Intravenous Once  . levetiracetam  1,500 mg Intravenous Q12H  . multivitamin  5 mL Per Tube Daily  . pregabalin  75 mg Oral Daily  . [DISCONTINUED] lacosamide (VIMPAT) IV  50 mg Intravenous Q12H    Assessment/Plan:  Patient Active Hospital Problem List: Status epilepticus (10/27/2012)   Assessment: There does seem to be some improvement in frequency with the initiation of Vimpat.  Patient is alert this morning and did not have a seizure during my evaluation.  Will increase dose to hopefully  achieve better seizure control.   Plan:  1. Increase Vimpat to 75 mg Q6hrs  2. Recheck BMET in AM    LOS: 5 days   Thana Farr, MD [facility] [phone] 11/01/2012  7:52 AM

## 2012-11-02 LAB — BASIC METABOLIC PANEL
BUN: 7 mg/dL (ref 6–23)
Chloride: 103 mEq/L (ref 96–112)
Creatinine, Ser: 0.26 mg/dL — ABNORMAL LOW (ref 0.50–1.10)
GFR calc Af Amer: >90 mL/min (ref >90)
GFR calc non Af Amer: >90 mL/min (ref >90)
Potassium: 4 mEq/L (ref 3.5–5.1)

## 2012-11-02 MED ORDER — SODIUM CHLORIDE 0.9 % IV SOLN
100.0000 mg | Freq: Four times a day (QID) | INTRAVENOUS | Status: DC
  Administered 2012-11-02 – 2012-11-06 (×17): 100 mg via INTRAVENOUS
  Filled 2012-11-02 (×38): qty 10

## 2012-11-02 MED ORDER — WHITE PETROLATUM GEL
Status: AC
  Administered 2012-11-02: 05:00:00
  Filled 2012-11-02: qty 5

## 2012-11-02 NOTE — Progress Notes (Signed)
Pt had seizure-like activity lasting around 3 minutes. Pt's mouth slightly twisted and pts lip quivering. Pt's mother stated that this was new for patient. IV ativan given and seizure-like activity stopped. Pt stable.  Will continue to monitor.

## 2012-11-02 NOTE — Progress Notes (Signed)
Patient ID: Zonia Horwedel, female   DOB: 1991-08-23, 21 y.o.   MRN: [id number]  [facility] Progress Note  Patient name: Allison [name] Medical record number: [id number] Date of birth: August 29, 1991 Age: 21 y.o. Gender: female    LOS: 6 days   Primary Care Provider: No primary provider on file.  Overnight Events: Seizures continue, but I have observed longer periods of time of alert baseline behavior and less lethargy.  Seizures also now appear to be somewhere between 10 and 15 seconds in duration although there are some that are as long as 30 seconds.  I witnessed 3 in a 10 minute period while i assessed the patient.  She continues to receive 75 mg a Vimpat 4 times daily and has no discernible side effects from the medicine.  Vimpat is still trying to reach steady state, but in the setting of continued cluster seizures, we will increase the dose to 100 mg 4 times a day.  Mother was not at bedside.  I will be happy to speak to her when she comes today which may be later in the day.  His seizures continue unchanged or not substantially improved, we will discuss with critical care medicine and EEG lab transferring her to ICU for pentobarbital coma.  We will need to have continuous monitoring.  If that is not possible we may need to transfer her to a tertiary care hospital.  Other options would be to start IV Depacon.  Objective: Vital signs in last 24 hours: Temp:  [97.6 F (36.4 C)-97.9 F (36.6 C)] 97.9 F (36.6 C) (11/08 0500) Pulse Rate:  [76-80] 80  (11/08 0500) Resp:  [17-18] 18  (11/08 0500) BP: (105-110)/(66-74) 105/74 mmHg (11/08 0500) SpO2:  [98 %-99 %] 99 % (11/08 0500) Weight:  [24 kg (52 lb 14.6 oz)] 24 kg (52 lb 14.6 oz) (11/08 0500)  Wt Readings from Last 3 Encounters:  11/02/12 24 kg (52 lb 14.6 oz)    Intake/Output Summary (Last 24 hours) at 11/02/12 0855 Last data filed at 11/02/12 0846  Gross per 24 hour  Intake    650 ml  Output      0 ml  Net    650 ml     Current Facility-Administered Medications  Medication Dose Route Frequency Provider Last Rate Last Dose  . 0.9 %  sodium chloride infusion  250 mL Intravenous PRN Carolan Clines, MD      . clonazePAM Scarlette Calico) tablet 0.5 mg  0.5 mg Oral TID Carolan Clines, MD   0.5 mg at 11/01/12 2225  . feeding supplement (JEVITY 1.2 CAL) liquid 237 mL  237 mL Per Tube TID Heather Cornelison Pitts, RD   237 mL at 11/02/12 0835  . felbamate (FELBATOL) 600 MG/5ML suspension 360 mg  360 mg Oral Q24H Nelda Bucks, MD   360 mg at 11/01/12 1513  . felbamate (FELBATOL) 600 MG/5ML suspension 480 mg  480 mg Per Tube Q24H Nelda Bucks, MD   480 mg at 11/02/12 0033  . felbamate (FELBATOL) 600 MG/5ML suspension 480 mg  480 mg Oral Q24H Nelda Bucks, MD   480 mg at 11/02/12 0835  . free water 140 mL  140 mL Per Tube TID Heather Cornelison Pitts, RD   140 mL at 11/02/12 0836  . heparin injection 5,000 Units  5,000 Units Subcutaneous Q12H Merwyn Katos, MD   5,000 Units at 11/01/12 2227  . ibuprofen (ADVIL,MOTRIN) 100 MG/5ML suspension 200 mg  200 mg  Oral Q4H PRN Carolan Clines, MD      . lacosamide (VIMPAT) 75 mg in sodium chloride 0.9 % 25 mL IVPB  75 mg Intravenous Q6H Thana Farr, MD   75 mg at 11/02/12 0358  . levETIRAcetam (KEPPRA) 1,500 mg in sodium chloride 0.9 % 100 mL IVPB  1,500 mg Intravenous Q12H Carolan Clines, MD   1,500 mg at 11/02/12 0835  . LORazepam (ATIVAN) injection 1 mg  1 mg Intravenous Q1H PRN Carolan Clines, MD   1 mg at 10/31/12 0744  . multivitamin liquid 5 mL  5 mL Per Tube Daily Heather Cornelison Pitts, RD   5 mL at 11/01/12 1057  . pregabalin (LYRICA) capsule 75 mg  75 mg Oral Daily Carolan Clines, MD   75 mg at 11/01/12 1930  . [COMPLETED] white petrolatum (VASELINE) gel            PE: ZOX:WRUE periods of alert behavior, still does not fix and follow or truly respond to the examiner.  This is baseline when she is not having  seizures. 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  Left side is more affected than the right in the arm and somewhat less in the leg. Neuro:Brief periods of alertness though she does not clearly 6 and following my face.  She will blink to bright light.  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  When the patient is having seizures, and she begins to close her eyes, her eyes deviate to the right, the right arm flexes toward her body and has both fine and coarse jerking movement.  There is grimacing of her face to the right and twitching of the face.   Sometimes the left arm is affected.   Episodes seem to be shorter in duration, and possibly more "violent".  There are longer periods between episodes, although in 1 there was only a minute, and others there were 2-3 minutes.  Labs/Studies: see laboratories performed for today  Assessment/Plan: Increase Vimpat to 100 mg every 6 hours.  No change in other medications.  I will follow her this weekend daily.  SignedDeetta Perla, MD Child neurology attending [phone] 11/02/2012 8:55 AM

## 2012-11-02 NOTE — Progress Notes (Signed)
Patient had had multiple seizures in the morning but became stable and slept from 1300-1630 when she was woken up to be changed. Over all she has been stable with less seizure activities. Vital signs have been stable as well. Will continue to monitor.

## 2012-11-02 NOTE — Progress Notes (Signed)
Subjective: Patient continues to have frequent seizures although frequency seems to be less with longer interictal activity. Seems to be tolerating new addition of Vimpat well.  BMET unremarkable.  Objective: Current vital signs: BP 105/74  Pulse 80  Temp 97.9 F (36.6 C) (Oral)  Resp 18  Ht 3' 10.06" (1.17 m)  Wt 24 kg (52 lb 14.6 oz)  BMI 17.53 kg/m2  SpO2 99%  LMP 10/26/2012 Vital signs in last 24 hours: Temp:  [97.6 F (36.4 C)-97.9 F (36.6 C)] 97.9 F (36.6 C) (11/08 0500) Pulse Rate:  [76-80] 80  (11/08 0500) Resp:  [17-18] 18  (11/08 0500) BP: (105-110)/(66-74) 105/74 mmHg (11/08 0500) SpO2:  [98 %-99 %] 99 % (11/08 0500) Weight:  [24 kg (52 lb 14.6 oz)] 24 kg (52 lb 14.6 oz) (11/08 0500)  Intake/Output from previous day: 11/07 0701 - 11/08 0700 In: 420 [IV Piggyback:360] Out: -  Intake/Output this shift: Total I/O In: 230 [Other:230] Out: -  Nutritional status:    Neurologic Exam: Mental Status:  Awake and alert. No speech noted. Does not follow commands.  Cranial Nerves:  II- unable to perform  III/IV/VI- Pupils reactive bilaterally.  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)  Sensory: not tested  Deep Tendon Reflexes: brisk reflexes throughout  Plantars: mute  Cerebellar: unable to test    Lab Results: Basic Metabolic Panel:  Lab 11/02/12 2952 10/31/12 0550 10/30/12 0900 10/29/12 0545 10/28/12 1657  NA 139 140 133* 139 137  K 4.0 4.1 4.7 2.8* 3.8  CL 103 105 99 105 106  CO2 27 26 21 24 20   GLUCOSE 121* 97 134* 124* 98  BUN 7 7 4* <3* 4*  CREATININE 0.26* 0.31* 0.23* 0.25* 0.28*  CALCIUM 9.5 9.1 9.1 -- --  MG -- 1.9 -- -- --  PHOS -- -- -- -- --    Liver Function Tests: No results found for this basename: AST:5,ALT:5,ALKPHOS:5,BILITOT:5,PROT:5,ALBUMIN:5 in the last 168 hours No results found for this basename:  LIPASE:5,AMYLASE:5 in the last 168 hours No results found for this basename: AMMONIA:3 in the last 168 hours  CBC:  Lab 10/29/12 0545 10/28/12 0430 10/27/12 0940  WBC 7.9 9.5 8.0  NEUTROABS -- -- 4.9  HGB 13.0 12.3 14.5  HCT 37.8 36.2 41.8  MCV 91.7 93.1 92.1  PLT 166 173 168    Cardiac Enzymes: No results found for this basename: CKTOTAL:5,CKMB:5,CKMBINDEX:5,TROPONINI:5 in the last 168 hours  Lipid Panel: No results found for this basename: CHOL:5,TRIG:5,HDL:5,CHOLHDL:5,VLDL:5,LDLCALC:5 in the last 168 hours  CBG: No results found for this basename: GLUCAP:5 in the last 168 hours  Microbiology: Results for orders placed during the hospital encounter of 10/27/12  MRSA PCR SCREENING     Status: Normal   Collection Time   10/27/12 10:31 PM      Component Value Range Status Comment   MRSA by PCR NEGATIVE  NEGATIVE Final     Coagulation Studies: No results found for this basename: LABPROT:5,INR:5 in the last 72 hours  Imaging: No results found.  Medications:  I have reviewed the patient's current medications. Scheduled:   . clonazePAM  0.5 mg Oral TID  . feeding supplement (JEVITY 1.2 CAL)  237 mL Per Tube TID  . felbamate  360 mg Oral Q24H  . felbamate  480 mg Per Tube Q24H  . felbamate  480 mg Oral Q24H  . free water  140 mL Per  Tube TID  . heparin  5,000 Units Subcutaneous Q12H  . lacosamide (VIMPAT) IV  100 mg Intravenous Q6H  . levetiracetam  1,500 mg Intravenous Q12H  . multivitamin  5 mL Per Tube Daily  . pregabalin  75 mg Oral Daily  . [COMPLETED] white petrolatum      . [DISCONTINUED] lacosamide (VIMPAT) IV  75 mg Intravenous Q6H    Assessment/Plan:  Patient Active Hospital Problem List: Status epilepticus (10/27/2012)   Assessment: Seizures continue although seems to be having some response to Vimpat.     Plan: Increase Vimpat to 100mg  Q6hrs    LOS: 6 days   Thana Farr, MD [facility] [phone] 11/02/2012  11:07 AM

## 2012-11-03 LAB — COMPREHENSIVE METABOLIC PANEL
ALT: 113 U/L — ABNORMAL HIGH (ref 0–35)
AST: 60 U/L — ABNORMAL HIGH (ref 0–37)
Albumin: 3.5 g/dL (ref 3.5–5.2)
CO2: 27 mEq/L (ref 19–32)
Calcium: 9.6 mg/dL (ref 8.4–10.5)
Creatinine, Ser: 0.32 mg/dL — ABNORMAL LOW (ref 0.50–1.10)
Sodium: 138 mEq/L (ref 135–145)

## 2012-11-03 MED ORDER — SODIUM CHLORIDE 0.9 % IV SOLN
200.0000 mg | Freq: Once | INTRAVENOUS | Status: AC
  Administered 2012-11-03: 200 mg via INTRAVENOUS
  Filled 2012-11-03 (×2): qty 4

## 2012-11-03 MED ORDER — PHENYTOIN SODIUM 50 MG/ML IJ SOLN
50.0000 mg | Freq: Three times a day (TID) | INTRAMUSCULAR | Status: DC
  Administered 2012-11-03 – 2012-11-06 (×9): 50 mg via INTRAVENOUS
  Filled 2012-11-03 (×11): qty 1

## 2012-11-03 NOTE — Procedures (Signed)
EEG NUMBER:  13 [phone].  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  She is on quadruple medications and Vimpat was added to levetiracetam, felbamate, clonazepam, and Lyrica.  The patient has periods when she is sleeping, when there are no obvious seizures.  EEG was done to evaluate her current clinical condition (345.11).  PROCEDURE:  The tracing is carried out on a 32-channel digital Cadwell recorder, reformatted into 16-channel montages with 1 devoted to EKG. The patient was awake and having frequent facial seizures.  DESCRIPTION OF FINDINGS:  During the clinical seizure activity, the frontal region show rhythmic delta range activity with significant muscle and movement artifact.  There is some spike-wave discharge.  In between episodes, the background shows low voltage, predominantly delta and lower theta range activity of 110 microvolts.  Some episodes are 10- 15 seconds in duration.  Others lasted as long as 10 minutes.  PROCEDURE:  During this time, the patient has experienced eyelid blinking, deviation and nystagmoid movements, her eyes opening and closing, her mouth with resting of her tongue.  There had been a few movements associated with her limbs which was characteristic of her seizures earlier in the week.  IMPRESSION:  Abnormal EEG on the basis of very frequent clusters of generalized tonic-clonic seizures and with a 10 minute episode of status epilepticus at the end of the study.     Allison [name]. Sharene Skeans, M.D.    ZOX:WRUE D:  11/03/2012 11:04:29  T:  11/03/2012 11:33:30  Job #:  [id number]

## 2012-11-03 NOTE — Progress Notes (Signed)
Patient ID: Allison [name], female   DOB: 01/23/1991, 21 y.o.   MRN: [id number] [facility] Progress Note  Patient name: Allison [name] Medical record number: [id number] Date of birth: 29-Apr-1991 Age: 21 y.o. Gender: female    LOS: 7 days   Primary Care Provider: No primary provider on file.  Overnight Events: See Dr. Thad Ranger' note.  We assessed the patient today.  She was lethargic, and not having obvious seizures.  When she was awakened however her seizures were quite frequent and she had an episode of 10 minutes of status epilepticus while being recorded on EEG.  Seizures are now more frontally predominant rather than generalized and seen to be limited to her eyelids, eyes, and face.  There is less limb activity.  We decided to load her with Dilantin to see if we can stop the seizures.  I spoke with mother and told her that we may need to move Allison [name] to the intensive care unit because of the increased frequency of her seizures.  I am contemplating intubation and pentobarbital coma once we can obtain prolonged EEG which should be Monday morning.  I also gave mother the option of transferring Allison [name] to a tertiary care center.  She prefers to remain at [facility] for now.  Objective: Vital signs in last 24 hours: Temp:  [97.4 F (36.3 C)-98 F (36.7 C)] 97.4 F (36.3 C) (11/09 0503) Pulse Rate:  [53-91] 58  (11/09 0648) Resp:  [15-18] 15  (11/09 0503) BP: (85-110)/(40-73) 92/53 mmHg (11/09 0648) SpO2:  [98 %-100 %] 100 % (11/09 0503) Weight:  [21.727 kg (47 lb 14.4 oz)] 21.727 kg (47 lb 14.4 oz) (11/09 0503)  Wt Readings from Last 3 Encounters:  11/03/12 21.727 kg (47 lb 14.4 oz)    Intake/Output Summary (Last 24 hours) at 11/03/12 1203 Last data filed at 11/03/12 0600  Gross per 24 hour  Intake    375 ml  Output      0 ml  Net    375 ml    Current Facility-Administered Medications  Medication Dose Route Frequency Provider Last Rate Last Dose   . 0.9 %  sodium chloride infusion  250 mL Intravenous PRN Carolan Clines, MD      . clonazePAM Scarlette Calico) tablet 0.5 mg  0.5 mg Oral TID Carolan Clines, MD   0.5 mg at 11/03/12 1037  . feeding supplement (JEVITY 1.2 CAL) liquid 237 mL  237 mL Per Tube TID Heather Cornelison Pitts, RD   237 mL at 11/03/12 0737  . felbamate (FELBATOL) 600 MG/5ML suspension 360 mg  360 mg Oral Q24H Nelda Bucks, MD   360 mg at 11/02/12 1611  . felbamate (FELBATOL) 600 MG/5ML suspension 480 mg  480 mg Per Tube Q24H Nelda Bucks, MD   480 mg at 11/03/12 0021  . felbamate (FELBATOL) 600 MG/5ML suspension 480 mg  480 mg Oral Q24H Nelda Bucks, MD   480 mg at 11/03/12 0737  . free water 140 mL  140 mL Per Tube TID Heather Cornelison Pitts, RD   140 mL at 11/03/12 0800  . heparin injection 5,000 Units  5,000 Units Subcutaneous Q12H Merwyn Katos, MD   5,000 Units at 11/03/12 1037  . ibuprofen (ADVIL,MOTRIN) 100 MG/5ML suspension 200 mg  200 mg Oral Q4H PRN Carolan Clines, MD      . lacosamide (VIMPAT) 100 mg in sodium chloride 0.9 % 25 mL IVPB  100 mg Intravenous Q6H  Deetta Perla, MD   100 mg at 11/03/12 1050  . levETIRAcetam (KEPPRA) 1,500 mg in sodium chloride 0.9 % 100 mL IVPB  1,500 mg Intravenous Q12H Carolan Clines, MD   1,500 mg at 11/03/12 0737  . LORazepam (ATIVAN) injection 1 mg  1 mg Intravenous Q1H PRN Carolan Clines, MD   1 mg at 11/02/12 2030  . multivitamin liquid 5 mL  5 mL Per Tube Daily Heather Cornelison Pitts, RD   5 mL at 11/03/12 1037  . phenytoin (DILANTIN) 200 mg in sodium chloride 0.9 % 100 mL IVPB  200 mg Intravenous Once Thana Farr, MD      . phenytoin (DILANTIN) injection 50 mg  50 mg Intravenous Q8H Thana Farr, MD      . pregabalin (LYRICA) capsule 75 mg  75 mg Oral Daily Carolan Clines, MD   75 mg at 11/02/12 2014   PE: WUJ:WJXBJY, in no distress, no obvious seizures HEENT:Small pupils that react under magnification, No signs of  infection, malar rash that appears eczematous CV:No murmurs, pulses normal, normal capillary refill NWG:NFAOZ clear to auscultation HYQ:MVHQ bowel sounds normal no hepatosplenomegaly gastrostomy site is clean and dry without erythema Ext/Musc:Flexion contractures all 4 extremities Neuro:Asleep, no spontaneous movements, not fully assessed  Labs/Studies:  Assessment/Plan: Seizures appear to be more frontally predominant and lasting longer.  When asleep, and she does not appear to be having seizures, but we were not able to capture that on EEG.  Dr. Thad Ranger has ordered a loading dose of Dilantin.  I note elevated liver function tests which we will need to track.  SignedDeetta Perla, MD Child neurology attending [phone] 11/03/2012 12:03 PM

## 2012-11-03 NOTE — Progress Notes (Signed)
Subjective: Patient has continued to have intermittent seizure activity.  Is more lethargic today and it is difficult to tell if this is post-ictal or a side effect of the rapidly increased Vimpat.   EEG performed today shows extensive epileptiform activity suggesting that we should be more aggressive with anticonvulsant therapy for seizure control.    Objective: Current vital signs: BP 92/53  Pulse 58  Temp 97.4 F (36.3 C) (Axillary)  Resp 15  Ht 3' 10.06" (1.17 m)  Wt 21.727 kg (47 lb 14.4 oz)  BMI 15.87 kg/m2  SpO2 100%  LMP 10/26/2012 Vital signs in last 24 hours: Temp:  [97.4 F (36.3 C)-98 F (36.7 C)] 97.4 F (36.3 C) (11/09 0503) Pulse Rate:  [53-91] 58  (11/09 0648) Resp:  [15-18] 15  (11/09 0503) BP: (85-110)/(40-73) 92/53 mmHg (11/09 0648) SpO2:  [98 %-100 %] 100 % (11/09 0503) Weight:  [21.727 kg (47 lb 14.4 oz)] 21.727 kg (47 lb 14.4 oz) (11/09 0503)  Intake/Output from previous day: 11/08 0701 - 11/09 0700 In: 605 [I.V.:110; IV Piggyback:265] Out: -  Intake/Output this shift:   Nutritional status:    Neurologic Exam: Mental Status:  Awake and alert. No speech noted. Does not follow commands.  Cranial Nerves:  II- unable to perform  III/IV/VI- Pupils reactive bilaterally.  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)  Sensory: not tested  Deep Tendon Reflexes: brisk reflexes throughout  Plantars: mute  Cerebellar: unable to test    Lab Results: Basic Metabolic Panel:  Lab 11/03/12 1610 11/02/12 0625 10/31/12 0550 10/30/12 0900 10/29/12 0545  NA 138 139 140 133* 139  K 4.6 4.0 4.1 4.7 2.8*  CL 101 103 105 99 105  CO2 27 27 26 21 24   GLUCOSE 94 121* 97 134* 124*  BUN 11 7 7  4* <3*  CREATININE 0.32* 0.26* 0.31* 0.23* 0.25*  CALCIUM 9.6 9.5 9.1 -- --  MG -- -- 1.9 -- --  PHOS -- -- -- -- --    Liver Function Tests:  Lab 11/03/12  0625  AST 60*  ALT 113*  ALKPHOS 109  BILITOT 0.2*  PROT 7.2  ALBUMIN 3.5   No results found for this basename: LIPASE:5,AMYLASE:5 in the last 168 hours No results found for this basename: AMMONIA:3 in the last 168 hours  CBC:  Lab 10/29/12 0545 10/28/12 0430  WBC 7.9 9.5  NEUTROABS -- --  HGB 13.0 12.3  HCT 37.8 36.2  MCV 91.7 93.1  PLT 166 173    Cardiac Enzymes: No results found for this basename: CKTOTAL:5,CKMB:5,CKMBINDEX:5,TROPONINI:5 in the last 168 hours  Lipid Panel: No results found for this basename: CHOL:5,TRIG:5,HDL:5,CHOLHDL:5,VLDL:5,LDLCALC:5 in the last 168 hours  CBG: No results found for this basename: GLUCAP:5 in the last 168 hours  Microbiology: Results for orders placed during the hospital encounter of 10/27/12  MRSA PCR SCREENING     Status: Normal   Collection Time   10/27/12 10:31 PM      Component Value Range Status Comment   MRSA by PCR NEGATIVE  NEGATIVE Final     Coagulation Studies: No results found for this basename: LABPROT:5,INR:5 in the last 72 hours  Imaging: No results found.  Medications:  I have reviewed the patient's current medications. Scheduled:   . clonazePAM  0.5 mg Oral TID  . feeding supplement (JEVITY 1.2 CAL)  237 mL Per Tube TID  . felbamate  360 mg  Oral Q24H  . felbamate  480 mg Per Tube Q24H  . felbamate  480 mg Oral Q24H  . free water  140 mL Per Tube TID  . heparin  5,000 Units Subcutaneous Q12H  . lacosamide (VIMPAT) IV  100 mg Intravenous Q6H  . levetiracetam  1,500 mg Intravenous Q12H  . multivitamin  5 mL Per Tube Daily  . pregabalin  75 mg Oral Daily    Assessment/Plan:  Patient Active Hospital Problem List: Status epilepticus (10/27/2012)   Assessment: Will load with Dilantin today and start maintenance.  Continue Vimpat, Felbamate Keppra and Lyrica at current doses.     Plan:  1.  Load with PHT at 200mg  now.  Continue maintenance at 50mg  IV q8hours.   2.  Dilantin level after load and in the  morning  3.  CMET, albumin  4.  Will consider transfer to ICU.  If patient unresponsive may need intubation and suppression of cerebral activity.       LOS: 7 days   Thana Farr, MD [facility] [phone] 11/03/2012  11:24 AM

## 2012-11-04 ENCOUNTER — Inpatient Hospital Stay (HOSPITAL_COMMUNITY): Payer: 59

## 2012-11-04 DIAGNOSIS — G40319 Generalized idiopathic epilepsy and epileptic syndromes, intractable, without status epilepticus: Secondary | ICD-10-CM

## 2012-11-04 LAB — COMPREHENSIVE METABOLIC PANEL
ALT: 89 U/L — ABNORMAL HIGH (ref 0–35)
AST: 35 U/L (ref 0–37)
Alkaline Phosphatase: 99 U/L (ref 39–117)
CO2: 26 mEq/L (ref 19–32)
Chloride: 104 mEq/L (ref 96–112)
GFR calc Af Amer: >90 mL/min (ref >90)
GFR calc non Af Amer: >90 mL/min (ref >90)
Glucose, Bld: 98 mg/dL (ref 70–99)
Potassium: 3.7 mEq/L (ref 3.5–5.1)
Sodium: 141 mEq/L (ref 135–145)
Total Bilirubin: 0.2 mg/dL — ABNORMAL LOW (ref 0.3–1.2)

## 2012-11-04 LAB — URINALYSIS, ROUTINE W REFLEX MICROSCOPIC
Bilirubin Urine: NEGATIVE
Ketones, ur: NEGATIVE mg/dL
Nitrite: NEGATIVE
Urobilinogen, UA: 0.2 mg/dL (ref 0.0–1.0)

## 2012-11-04 LAB — PHENYTOIN LEVEL, TOTAL: Phenytoin Lvl: 9 ug/mL — ABNORMAL LOW (ref 10.0–20.0)

## 2012-11-04 NOTE — Progress Notes (Signed)
Pt had a better day today, witnessed her seizure activity only twice today with facial twitching and frequent eyelid blinking lasted only for few seconds. She been comfortable resting with in and out naps today. Eye lids been quiet most of the day. Had good urine output. Her temp been normal whole day. Started a new IV. No new complaints. Will keep close monitoring.

## 2012-11-04 NOTE — Progress Notes (Signed)
Patient ID: Allison [name], female   DOB: 1991-10-20, 21 y.o.   MRN: [id number]  [facility] Progress Note  Patient name: Allison [name] Medical record number: [id number] Date of birth: 03-23-1991 Age: 21 y.o. Gender: female    LOS: 8 days   Primary Care Provider: No primary provider on file.  Overnight Events: Allison [name] has had infrequent seizures over the night both went to sleep and awake.  Currently she is having episodes of rapid eyelid fluttering without nystagmus.  This may represent eyelid myoclonus, but there did not appear to be any other seizures.  There were no seizures during the tenderness during which I evaluated her.  It is not clear to me if Vimpat has finally reached a steady state level, or whether intravenous Dilantin plus Dilantin maintenance dose has been responsible.  Objective: Vital signs in last 24 hours: Temp:  [95.8 F (35.4 C)-98.4 F (36.9 C)] 95.8 F (35.4 C) (11/10 0540) Pulse Rate:  [56-88] 67  (11/10 0600) Resp:  [16] 16  (11/10 0600) BP: (86-94)/(41-46) 94/44 mmHg (11/10 0600) SpO2:  [99 %-100 %] 99 % (11/10 0600) Weight:  [22.725 kg (50 lb 1.6 oz)] 22.725 kg (50 lb 1.6 oz) (11/10 0500)  Wt Readings from Last 3 Encounters:  11/04/12 22.725 kg (50 lb 1.6 oz)   No intake or output data in the 24 hours ending 11/04/12 0827   Current Facility-Administered Medications  Medication Dose Route Frequency Provider Last Rate Last Dose  . 0.9 %  sodium chloride infusion  250 mL Intravenous PRN Carolan Clines, MD      . clonazePAM Scarlette Calico) tablet 0.5 mg  0.5 mg Oral TID Carolan Clines, MD   0.5 mg at 11/03/12 2203  . feeding supplement (JEVITY 1.2 CAL) liquid 237 mL  237 mL Per Tube TID Heather Cornelison Pitts, RD   237 mL at 11/03/12 1727  . felbamate (FELBATOL) 600 MG/5ML suspension 360 mg  360 mg Oral Q24H Nelda Bucks, MD   360 mg at 11/03/12 1616  . felbamate (FELBATOL) 600 MG/5ML suspension 480 mg  480 mg Per Tube Q24H Nelda Bucks, MD   480 mg at 11/04/12 0030  . felbamate (FELBATOL) 600 MG/5ML suspension 480 mg  480 mg Oral Q24H Nelda Bucks, MD   480 mg at 11/03/12 0737  . free water 140 mL  140 mL Per Tube TID Heather Cornelison Pitts, RD   140 mL at 11/03/12 1727  . heparin injection 5,000 Units  5,000 Units Subcutaneous Q12H Merwyn Katos, MD   5,000 Units at 11/03/12 2203  . ibuprofen (ADVIL,MOTRIN) 100 MG/5ML suspension 200 mg  200 mg Oral Q4H PRN Carolan Clines, MD      . lacosamide (VIMPAT) 100 mg in sodium chloride 0.9 % 25 mL IVPB  100 mg Intravenous Q6H Deetta Perla, MD   100 mg at 11/04/12 0520  . levETIRAcetam (KEPPRA) 1,500 mg in sodium chloride 0.9 % 100 mL IVPB  1,500 mg Intravenous Q12H Carolan Clines, MD   1,500 mg at 11/03/12 1947  . LORazepam (ATIVAN) injection 1 mg  1 mg Intravenous Q1H PRN Carolan Clines, MD   1 mg at 11/02/12 2030  . multivitamin liquid 5 mL  5 mL Per Tube Daily Heather Cornelison Pitts, RD   5 mL at 11/03/12 1037  . [COMPLETED] phenytoin (DILANTIN) 200 mg in sodium chloride 0.9 % 100 mL IVPB  200 mg Intravenous Once Thana Farr, MD  200 mg at 11/03/12 1353  . phenytoin (DILANTIN) injection 50 mg  50 mg Intravenous Q8H Thana Farr, MD   50 mg at 11/04/12 0607  . 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  She does not clearly fix on my face.  Pupils are 3 mm and reactive.  Positive red reflex, symmetric facial strength, frequent eyelid blinking Limited movements of her limbs Deep tendon reflexes are diminished but symmetric she had sustained ankle clonus bilaterally and  neutral plantar responses  Labs/Studies:  Assessment/Plan:  We will observe her today.  There is no reason to transfer her to intensive care unit as long as she is not having seizures at the frequency that she was.  Plan to obtain another EEG on Monday.  We will determine further plans based on her clinical course.  SignedDeetta Perla, MD Child neurology attending [phone] 11/04/2012 8:27 AM

## 2012-11-04 NOTE — Progress Notes (Signed)
Patients temp at 0545 was 95.8 on rectal. Warm blankets given and rechecked temp at 0615 was 96.6. Notified Dr Amada Jupiter. Orders received. Will continue to monitor.

## 2012-11-04 NOTE — Progress Notes (Signed)
Ms.Spencers mother states she noticed around six small seizure like movements with facial twitching(each last about two sec) this evening while she was with her.

## 2012-11-04 NOTE — Progress Notes (Signed)
Subjective: Patient awake.  Flutters eyelids frequently.  No seizure activity noted.  Patient hypothermic overnight.  Objective: Current vital signs: BP 94/44  Pulse 67  Temp 95.8 F (35.4 C) (Rectal)  Resp 16  Ht 3' 10.06" (1.17 m)  Wt 22.725 kg (50 lb 1.6 oz)  BMI 16.60 kg/m2  SpO2 99%  LMP 10/26/2012 Vital signs in last 24 hours: Temp:  [95.8 F (35.4 C)-98.4 F (36.9 C)] 95.8 F (35.4 C) (11/10 0540) Pulse Rate:  [56-88] 67  (11/10 0600) Resp:  [16] 16  (11/10 0600) BP: (86-94)/(41-46) 94/44 mmHg (11/10 0600) SpO2:  [99 %-100 %] 99 % (11/10 0600) Weight:  [22.725 kg (50 lb 1.6 oz)] 22.725 kg (50 lb 1.6 oz) (11/10 0500)  Intake/Output from previous day:   Intake/Output this shift:   Nutritional status:    Neurologic Exam: Mental Status:  Awake and alert. No speech noted. Does not follow commands.  Cranial Nerves:  II- unable to perform  III/IV/VI- Pupils reactive bilaterally.  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)  Sensory: not tested  Deep Tendon Reflexes: brisk reflexes throughout  Plantars: mute  Cerebellar: unable to test   Lung: Clear to auscultation CV: Single S1, S2 Skin: no evidence of breakdown  Lab Results: Basic Metabolic Panel:  Lab 11/04/12 1610 11/03/12 0625 11/02/12 0625 10/31/12 0550 10/30/12 0900  NA 141 138 139 140 133*  K 3.7 4.6 4.0 4.1 4.7  CL 104 101 103 105 99  CO2 26 27 27 26 21   GLUCOSE 98 94 121* 97 134*  BUN 10 11 7 7  4*  CREATININE 0.24* 0.32* 0.26* 0.31* 0.23*  CALCIUM 9.2 9.6 9.5 -- --  MG -- -- -- 1.9 --  PHOS -- -- -- -- --    Liver Function Tests:  Lab 11/04/12 0600 11/03/12 0625  AST 35 60*  ALT 89* 113*  ALKPHOS 99 109  BILITOT 0.2* 0.2*  PROT 6.7 7.2  ALBUMIN 3.3* 3.5   No results found for this basename: LIPASE:5,AMYLASE:5 in the last 168 hours No results found for this basename:  AMMONIA:3 in the last 168 hours  CBC:  Lab 10/29/12 0545  WBC 7.9  NEUTROABS --  HGB 13.0  HCT 37.8  MCV 91.7  PLT 166    Cardiac Enzymes: No results found for this basename: CKTOTAL:5,CKMB:5,CKMBINDEX:5,TROPONINI:5 in the last 168 hours  Lipid Panel: No results found for this basename: CHOL:5,TRIG:5,HDL:5,CHOLHDL:5,VLDL:5,LDLCALC:5 in the last 168 hours  CBG: No results found for this basename: GLUCAP:5 in the last 168 hours  Microbiology: Results for orders placed during the hospital encounter of 10/27/12  MRSA PCR SCREENING     Status: Normal   Collection Time   10/27/12 10:31 PM      Component Value Range Status Comment   MRSA by PCR NEGATIVE  NEGATIVE Final     Coagulation Studies: No results found for this basename: LABPROT:5,INR:5 in the last 72 hours  Imaging: Dg Chest 1 View  11/04/2012  *RADIOLOGY REPORT*  Clinical Data: Concern for infection.  CHEST - 1 VIEW  Comparison: 10/27/2012  Findings: The patient has had posterior spinal fusion.  Heart size is accentuated by portable technique and patient rotation.  There is mild prominence of interstitial markings.  This raises a question of viral infection.  There are no focal consolidations or pleural effusions.  No evidence for pulmonary edema.  IMPRESSION: Findings suspicious for viral infection.  Original Report Authenticated By: Norva Pavlov, M.D.     Medications:  I have reviewed the patient's current medications. Scheduled:   . clonazePAM  0.5 mg Oral TID  . feeding supplement (JEVITY 1.2 CAL)  237 mL Per Tube TID  . felbamate  360 mg Oral Q24H  . felbamate  480 mg Per Tube Q24H  . felbamate  480 mg Oral Q24H  . free water  140 mL Per Tube TID  . heparin  5,000 Units Subcutaneous Q12H  . lacosamide (VIMPAT) IV  100 mg Intravenous Q6H  . levetiracetam  1,500 mg Intravenous Q12H  . multivitamin  5 mL Per Tube Daily  . [COMPLETED] phenytoin (DILANTIN) IV  200 mg Intravenous Once  . phenytoin  (DILANTIN) IV  50 mg Intravenous Q8H  . pregabalin  75 mg Oral Daily    Assessment/Plan:  Patient Active Hospital Problem List: Status epilepticus (10/27/2012)   Assessment: Seizure activity seems to have improved with the initiation of Dilantin and the increase in Vimpat.  Dilantin level today of 9.0.   Plan: Repeat EEG in AM Hypothermia ()   Assessment: Unclear if related to infection.  Patient does not look ill.  Will work up   Plan:  1.  Blood cultures  2.  Urine for urinalysis  3.  CXR    LOS: 8 days   Thana Farr, MD [facility] [phone] 11/04/2012  1:35 PM

## 2012-11-05 ENCOUNTER — Inpatient Hospital Stay (HOSPITAL_COMMUNITY): Payer: 59

## 2012-11-05 NOTE — Progress Notes (Signed)
She had six witnessed episodes of seizures only with eyelid movements mostly blinking lasted for 1.5 - 2 minutes, not very frequent. And two episodes of seizures with muscle twitching and spasms on upper and lower extremities mostly involving  the right side lasted for 15-20sec. Patient slept for most of the shift and noticed no seizures while asleep. Temperature was normal for the whole shift.  Had good urine output and had a bowel movement.

## 2012-11-05 NOTE — Progress Notes (Signed)
Subjective: Patient seems improved today.  Awake and alert.  Seems to be tracking somewhat around the room and responding somewhat to conversation.  There has been some seizure activity noted but it does seem improved from earlier in the hospitalization.    Objective: Current vital signs: BP 92/63  Pulse 83  Temp 97.7 F (36.5 C) (Rectal)  Resp 16  Ht 3' 10.06" (1.17 m)  Wt 23.179 kg (51 lb 1.6 oz)  BMI 16.93 kg/m2  SpO2 100%  LMP 10/26/2012 Vital signs in last 24 hours: Temp:  [97.6 F (36.4 C)-97.7 F (36.5 C)] 97.7 F (36.5 C) (11/11 0600) Pulse Rate:  [83-92] 83  (11/11 0530) Resp:  [16] 16  (11/11 0530) BP: (89-92)/(43-63) 92/63 mmHg (11/11 0530) SpO2:  [99 %-100 %] 100 % (11/11 0530) Weight:  [22.771 kg (50 lb 3.2 oz)-23.179 kg (51 lb 1.6 oz)] 23.179 kg (51 lb 1.6 oz) (11/11 0734)  Intake/Output from previous day: 11/10 0701 - 11/11 0700 In: 880 [IV Piggyback:780] Out: -  Intake/Output this shift:   Nutritional status:    Neurologic Exam: Mental Status:  Awake and alert. No speech noted. Does not follow commands.  Cranial Nerves:  II- unable to perform  III/IV/VI- Pupils reactive bilaterally.  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)  Sensory: not tested  Deep Tendon Reflexes: brisk reflexes throughout  Plantars: mute  Cerebellar: unable to test    Lab Results: Basic Metabolic Panel:  Lab 11/04/12 1610 11/03/12 0625 11/02/12 0625 10/31/12 0550 10/30/12 0900  NA 141 138 139 140 133*  K 3.7 4.6 4.0 4.1 4.7  CL 104 101 103 105 99  CO2 26 27 27 26 21   GLUCOSE 98 94 121* 97 134*  BUN 10 11 7 7  4*  CREATININE 0.24* 0.32* 0.26* 0.31* 0.23*  CALCIUM 9.2 9.6 9.5 -- --  MG -- -- -- 1.9 --  PHOS -- -- -- -- --    Liver Function Tests:  Lab 11/04/12 0600 11/03/12 0625  AST 35 60*  ALT 89* 113*  ALKPHOS 99 109  BILITOT 0.2* 0.2*  PROT 6.7  7.2  ALBUMIN 3.3* 3.5   No results found for this basename: LIPASE:5,AMYLASE:5 in the last 168 hours No results found for this basename: AMMONIA:3 in the last 168 hours  CBC: No results found for this basename: WBC:5,NEUTROABS:5,HGB:5,HCT:5,MCV:5,PLT:5 in the last 168 hours  Cardiac Enzymes: No results found for this basename: CKTOTAL:5,CKMB:5,CKMBINDEX:5,TROPONINI:5 in the last 168 hours  Lipid Panel: No results found for this basename: CHOL:5,TRIG:5,HDL:5,CHOLHDL:5,VLDL:5,LDLCALC:5 in the last 168 hours  CBG: No results found for this basename: GLUCAP:5 in the last 168 hours  Microbiology: Results for orders placed during the hospital encounter of 10/27/12  MRSA PCR SCREENING     Status: Normal   Collection Time   10/27/12 10:31 PM      Component Value Range Status Comment   MRSA by PCR NEGATIVE  NEGATIVE Final   CULTURE, BLOOD (ROUTINE X 2)     Status: Normal (Preliminary result)   Collection Time   11/04/12 12:40 PM      Component Value Range Status Comment   Specimen Description BLOOD ARM RIGHT   Final    Special Requests BOTTLES DRAWN AEROBIC AND ANAEROBIC 10CC   Final    Culture  Setup Time 11/04/2012 21:02   Final    Culture     Final    Value:  BLOOD CULTURE RECEIVED NO GROWTH TO DATE CULTURE WILL BE HELD FOR 5 DAYS BEFORE ISSUING A FINAL NEGATIVE REPORT   Report Status PENDING   Incomplete   CULTURE, BLOOD (ROUTINE X 2)     Status: Normal (Preliminary result)   Collection Time   11/04/12 12:50 PM      Component Value Range Status Comment   Specimen Description BLOOD HAND RIGHT   Final    Special Requests BOTTLES DRAWN AEROBIC ONLY 6CC   Final    Culture  Setup Time 11/04/2012 21:02   Final    Culture     Final    Value:        BLOOD CULTURE RECEIVED NO GROWTH TO DATE CULTURE WILL BE HELD FOR 5 DAYS BEFORE ISSUING A FINAL NEGATIVE REPORT   Report Status PENDING   Incomplete     Coagulation Studies: No results found for this basename: LABPROT:5,INR:5 in  the last 72 hours  Imaging: Dg Chest 1 View  11/04/2012  *RADIOLOGY REPORT*  Clinical Data: Concern for infection.  CHEST - 1 VIEW  Comparison: 10/27/2012  Findings: The patient has had posterior spinal fusion.  Heart size is accentuated by portable technique and patient rotation.  There is mild prominence of interstitial markings.  This raises a question of viral infection.  There are no focal consolidations or pleural effusions.  No evidence for pulmonary edema.  IMPRESSION: Findings suspicious for viral infection.   Original Report Authenticated By: Norva Pavlov, M.D.     Medications:  I have reviewed the patient's current medications. Scheduled:   . clonazePAM  0.5 mg Oral TID  . feeding supplement (JEVITY 1.2 CAL)  237 mL Per Tube TID  . felbamate  360 mg Oral Q24H  . felbamate  480 mg Per Tube Q24H  . felbamate  480 mg Oral Q24H  . free water  140 mL Per Tube TID  . heparin  5,000 Units Subcutaneous Q12H  . lacosamide (VIMPAT) IV  100 mg Intravenous Q6H  . levetiracetam  1,500 mg Intravenous Q12H  . multivitamin  5 mL Per Tube Daily  . phenytoin (DILANTIN) IV  50 mg Intravenous Q8H  . pregabalin  75 mg Oral Daily    Assessment/Plan:  Patient Active Hospital Problem List: Status epilepticus (10/27/2012)   Assessment: Seizures seem clinically to be improved but there was evidence on her last EEG that there may be siezure activity that is not as apparent clinically.  EEG to be repeated today. Patient now on Dilantin, Felbamate, Vimpat, Keppra and Lyrica   Plan: EEG results to be interpreted and further treatment plans to be made at that time.   Hypothermia ()   Assessment: Temp now above 97.5.  Blood cultures and urine negative.  CXR only significant for mild prominence of interstitial markings.     Plan: Will continue to follow clinically.      LOS: 9 days   Thana Farr, MD [facility] [phone] 11/05/2012  12:11 PM

## 2012-11-05 NOTE — Progress Notes (Signed)
Patient ID: Allison [name], female   DOB: 1991/05/16, 21 y.o.   MRN: [id number]  Neurology Hospital Progress Note  Patient name: Allison [name] Medical record number: [id number] Date of birth: May 31, 1991 Age: 21 y.o. Gender: female    LOS: 9 days   Primary Care Provider: No primary provider on file.  Overnight Events: Allison [name] has experienced frequent episodes of eyelid movements.  It's not clear that these are seizures.  At times these are combined with facial twitching.  On 2 occasions she also had tonic posturing and clonic activity of her right upper extremity for 15-20 seconds which is characteristic of her seizures.  It appears the seizures have markedly diminished.  We'll need to check an EEG today to confirm that.  The main difficulty is whether or not send her out on the to answer medicines that we have started during this hospitalization.  Her temperature has improved to normal.  Liver functions declined.  No new medical problems are present.  Objective: Vital signs in last 24 hours: Temp:  [97.2 F (36.2 C)-97.7 F (36.5 C)] 97.7 F (36.5 C) (11/11 0600) Pulse Rate:  [83-92] 83  (11/11 0530) Resp:  [16] 16  (11/11 0530) BP: (89-92)/(43-63) 92/63 mmHg (11/11 0530) SpO2:  [99 %-100 %] 100 % (11/11 0530) Weight:  [22.771 kg (50 lb 3.2 oz)-23.179 kg (51 lb 1.6 oz)] 23.179 kg (51 lb 1.6 oz) (11/11 0734)  Wt Readings from Last 3 Encounters:  11/05/12 23.179 kg (51 lb 1.6 oz)    Intake/Output Summary (Last 24 hours) at 11/05/12 0746 Last data filed at 11/05/12 0600  Gross per 24 hour  Intake    880 ml  Output      0 ml  Net    880 ml    Current Facility-Administered Medications  Medication Dose Route Frequency Provider Last Rate Last Dose  . 0.9 %  sodium chloride infusion  250 mL Intravenous PRN Carolan Clines, MD      . clonazePAM Scarlette Calico) tablet 0.5 mg  0.5 mg Oral TID Carolan Clines, MD   0.5 mg at 11/04/12 2126  . feeding supplement (JEVITY 1.2 CAL) liquid  237 mL  237 mL Per Tube TID Heather Cornelison Pitts, RD   237 mL at 11/04/12 1737  . felbamate (FELBATOL) 600 MG/5ML suspension 360 mg  360 mg Oral Q24H Nelda Bucks, MD   360 mg at 11/04/12 1600  . felbamate (FELBATOL) 600 MG/5ML suspension 480 mg  480 mg Per Tube Q24H Nelda Bucks, MD   480 mg at 11/04/12 2348  . felbamate (FELBATOL) 600 MG/5ML suspension 480 mg  480 mg Oral Q24H Nelda Bucks, MD   480 mg at 11/04/12 0800  . free water 140 mL  140 mL Per Tube TID Heather Cornelison Pitts, RD   140 mL at 11/04/12 1800  . heparin injection 5,000 Units  5,000 Units Subcutaneous Q12H Merwyn Katos, MD   5,000 Units at 11/04/12 2126  . ibuprofen (ADVIL,MOTRIN) 100 MG/5ML suspension 200 mg  200 mg Oral Q4H PRN Carolan Clines, MD      . lacosamide (VIMPAT) 100 mg in sodium chloride 0.9 % 25 mL IVPB  100 mg Intravenous Q6H Deetta Perla, MD   100 mg at 11/05/12 0412  . levETIRAcetam (KEPPRA) 1,500 mg in sodium chloride 0.9 % 100 mL IVPB  1,500 mg Intravenous Q12H Carolan Clines, MD   1,500 mg at 11/04/12 1935  . LORazepam (ATIVAN) injection 1  mg  1 mg Intravenous Q1H PRN Carolan Clines, MD   1 mg at 11/02/12 2030  . multivitamin liquid 5 mL  5 mL Per Tube Daily Heather Cornelison Pitts, RD   5 mL at 11/04/12 1022  . phenytoin (DILANTIN) injection 50 mg  50 mg Intravenous Q8H Thana Farr, MD   50 mg at 11/05/12 0534  . 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 Thad Ranger note for further information  Assessment/Plan: 1.  Repeat EEG today. 2.  This EEG is markedly improved, we'll need to make a decision about whether Vimpat and Dilantin get added to her medications.  I'm very concerned about the significant antiepileptic polypharmacy.   3.  I am pleased that she is moving back to her baseline but her seizures are still more frequent than normal, but probably infrequent enough that we could discharge her home. 4.  I'll discuss further management with Dr. Thad Ranger.  SignedDeetta Perla, MD Child neurology attending [phone] 11/05/2012 7:46 AM

## 2012-11-05 NOTE — Progress Notes (Signed)
Patient has been stable today without any seizure activity witnessed. Vitals also stable. Mom at bedside.

## 2012-11-06 MED ORDER — CLONAZEPAM 0.5 MG PO TABS: 0.5000 mg | ORAL_TABLET | Freq: Three times a day (TID) | ORAL | Status: DC

## 2012-11-06 MED ORDER — LACOSAMIDE 100 MG PO TABS: 100.0000 mg | ORAL_TABLET | Freq: Four times a day (QID) | ORAL | Status: DC

## 2012-11-06 MED ORDER — PHENYTOIN 50 MG PO CHEW: 50.0000 mg | CHEWABLE_TABLET | Freq: Three times a day (TID) | ORAL | Status: DC

## 2012-11-06 NOTE — Progress Notes (Signed)
Patient ID: Allison [name], female   DOB: 09-07-1991, 21 y.o.   MRN: [id number]  Neurology Hospital Progress Note  Patient name: Allison [name] Medical record number: [id number] Date of birth: 01/24/1991 Age: 21 y.o. Gender: female    LOS: 10 days   Primary Care Provider: No primary provider on file.  Overnight Events: [name] had limited seizures over the last 36 hours.  Her EEG yesterday showed diffuse background slowing without any interictal or ictal activity.  She is ready for discharge.  I would recommend discharging her on the four antiepileptic medicines that she took prior to admission plus the 2 medicines that we have added.  I'm concerned that if we pull these medicines away at this point, that seizures will restart.  I will see her as an add-on within one month in my office, and we will begin to taper her medications if she has remained stable.  I appreciate the assistance of the neurohospitalist staff during this hospitalization.  I can be reached for questions or concerns at [phone]. Objective: Vital signs in last 24 hours: Temp:  [97.3 F (36.3 C)-98.3 F (36.8 C)] 97.8 F (36.6 C) (11/12 0600) Pulse Rate:  [69-92] 69  (11/12 0600) Resp:  [18] 18  (11/12 0600) BP: (103-116)/(61-76) 103/68 mmHg (11/12 0600) SpO2:  [99 %-100 %] 99 % (11/12 0600) Weight:  [22.5 kg (49 lb 9.7 oz)-22.8 kg (50 lb 4.2 oz)] 22.5 kg (49 lb 9.7 oz) (11/12 0600)  Wt Readings from Last 3 Encounters:  11/06/12 22.5 kg (49 lb 9.7 oz)    Intake/Output Summary (Last 24 hours) at 11/06/12 3664 Last data filed at 11/05/12 2106  Gross per 24 hour  Intake     60 ml  Output      0 ml  Net     60 ml    Current Facility-Administered Medications  Medication Dose Route Frequency Provider Last Rate Last Dose  . 0.9 %  sodium chloride infusion  250 mL Intravenous PRN Carolan Clines, MD      . clonazePAM Scarlette Calico) tablet 0.5 mg  0.5 mg Oral TID Carolan Clines, MD   0.5 mg at 11/05/12 2108    . feeding supplement (JEVITY 1.2 CAL) liquid 237 mL  237 mL Per Tube TID Heather Cornelison Pitts, RD   237 mL at 11/06/12 0806  . felbamate (FELBATOL) 600 MG/5ML suspension 360 mg  360 mg Oral Q24H Nelda Bucks, MD   360 mg at 11/05/12 1655  . felbamate (FELBATOL) 600 MG/5ML suspension 480 mg  480 mg Per Tube Q24H Nelda Bucks, MD   480 mg at 11/06/12 0043  . felbamate (FELBATOL) 600 MG/5ML suspension 480 mg  480 mg Oral Q24H Nelda Bucks, MD   480 mg at 11/06/12 0807  . free water 140 mL  140 mL Per Tube TID Heather Cornelison Pitts, RD   140 mL at 11/06/12 0807  . heparin injection 5,000 Units  5,000 Units Subcutaneous Q12H Merwyn Katos, MD   5,000 Units at 11/05/12 2110  . ibuprofen (ADVIL,MOTRIN) 100 MG/5ML suspension 200 mg  200 mg Oral Q4H PRN Carolan Clines, MD      . lacosamide (VIMPAT) 100 mg in sodium chloride 0.9 % 25 mL IVPB  100 mg Intravenous Q6H Deetta Perla, MD   100 mg at 11/06/12 0504  . levETIRAcetam (KEPPRA) 1,500 mg in sodium chloride 0.9 % 100 mL IVPB  1,500 mg Intravenous Q12H Carolan Clines, MD  1,500 mg at 11/06/12 0806  . LORazepam (ATIVAN) injection 1 mg  1 mg Intravenous Q1H PRN Carolan Clines, MD   1 mg at 11/02/12 2030  . multivitamin liquid 5 mL  5 mL Per Tube Daily Heather Cornelison Pitts, RD   5 mL at 11/05/12 0914  . phenytoin (DILANTIN) injection 50 mg  50 mg Intravenous Q8H Thana Farr, MD   50 mg at 11/06/12 0559  . pregabalin (LYRICA) capsule 75 mg  75 mg Oral Daily Carolan Clines, MD   75 mg at 11/05/12 2108   PE:Not examined today Gen: HEENT: CV: Res: Abd: Ext/Musc: Neuro:  Labs/Studies:  Assessment/Plan: I discussed the situation with mother who is at bedside.  She is in agreement with this plan.  Signed: Deetta Perla, MD Child neurology attending [phone] 11/06/2012 8:22 AM

## 2012-11-06 NOTE — Discharge Summary (Signed)
Physician Discharge Summary   Patient ID: Allison [name] [id number] 21 y.o. [birth year]  Admit date: 10/27/2012  Discharge date and time: 11/06/2012, 1:47 PM  Admitting Physician: Hillary Bow, DO   Discharge Physician: Dr. Thad Ranger  Admission Diagnoses: Cerebral palsy [343.9] Seizures [780.39] Status epilepticus  Discharge Diagnoses: seizures  Admission Condition: stable  Discharged Condition: good  Indication for Admission: seizure  Hospital Course: Pateint was brought to hospital after mother noted 4 day history of fever and increased seizure activity.  While hospitalized patient continued to have seizures with EEG showing 16 events in a 25 minute period. Initially Vimpat was started at 50 mg 4 times daily, which was then increased to 75 mg q 6 hours, then 100 mg every 6 hours. Due to continued seizure activity Dilantin was added to Vimpat. At time of discharge patient was seizure free on Vimpat, Keppra, Dilantin, Clonazepam, Felbatol and Lyrica.   Consults: [facility], [facility]  Significant Diagnostic Studies: radiology: CXR: suspicious for viral infection and CT scan: Severe chronic changes as described likely related to prematurity with hypoxic ischemic encephalopathy. 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 No visible acute intracranial hemorrhage, extra-axial fluid collection, or mass lesion which might contribute to increased seizure frequency.  Treatments: Antiepileptic medications  Discharge Exam: Mental Status: Patient is awake and will look toward my voice.  Looking around room. No understandable verbal output. Cranial Nerves: II: Visual fields grossly intact to DSS, pupils equal, round, reactive to light and accommodation III,IV, VI: ptosis not present, extra-ocular motions intact bilaterally V,VII: face symmetric, facial light touch intact VIII: unable to perform XII: midline  tongue extension Motor: Bilateral arms held inflexion, fingers held in flexion, increased tone in bilateral UE. Legs held in flexion with increased tone when extending at knees.  Tone and bulk:normal tone throughout; no atrophy noted Sensory: not tested Deep Tendon Reflexes: 2+  throughout Plantars: Right: downgoing   Left: downgoing Cerebellar-not tested CV: pulses palpable throughout     Disposition: To be discharged home with follow up with Dr. Sharene Skeans on December 3 9:30 am.   Patient Instructions:    Medication List     As of 11/06/2012  1:47 PM    TAKE these medications         clonazePAM 0.5 MG tablet   Commonly known as: KLONOPIN   Take 1 tablet (0.5 mg total) by mouth 3 (three) times daily.      clonazePAM 0.5 MG tablet   Commonly known as: KLONOPIN   Take 0.5 mg by mouth 3 (three) times daily.      felbamate 600 MG/5ML suspension   Commonly known as: FELBATOL   Take 5 mg by mouth 2 (two) times daily. 1 teaspoonful twice per day      ibuprofen 100 MG/5ML suspension   Commonly known as: ADVIL,MOTRIN   Take 200 mg by mouth every 4 (four) hours as needed. For fever      Lacosamide 100 MG Tabs   Take 1 tablet (100 mg total) by mouth every 6 (six) hours.      levETIRAcetam 1000 MG tablet   Commonly known as: KEPPRA   Take 1,000-1,500 mg by mouth 2 (two) times daily. 1.5 tablets in the morning and 1 tablet at night      phenytoin 50 MG tablet   Commonly known as: DILANTIN   Chew 1 tablet (50 mg total) by mouth every 8 (eight) hours.  pregabalin 75 MG capsule   Commonly known as: LYRICA   Take 75 mg by mouth daily.       Activity: as tolerated Diet: as prior to hospitaliztion Wound Care: none  Follow-up with Dr. Sharene Skeans on December 3rd at 9:30 am.  Signed:  Felicie Morn PA-C [facility] [phone]  11/06/2012, 2:10 PM   11/06/2012 1:47 PM  Patient seen and evaluated.  Seizures decreased.  Patient seems to be nearing baseline.   Discharge disposition discussed with Dr. Sharene Skeans.  Patient to be discharged home today.  F/U with Dr. Merleen Milliner.  45 minutes spent in discharge of this patient.  Thana Farr, MD [facility] [phone]

## 2012-11-06 NOTE — Procedures (Signed)
EEG NUMBER:  13 [id number].  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 The study is being done to look for occult seizure activity 345.01, 345.11. MEDICATIONS:  Medications entering the hospital included levetiracetam, felbamate, clonazepam and Lyrica.  To this has been added Vimpat and Dilantin.  Seizure activity has finally become infrequent, although still somewhat more frequent than previously.  Initial EEG showed generalized seizure activity.  Subsequent EEGs have shown more frontally predominant activity.  The inciting event causing destabilization is unclear.    PROCEDURE:  The tracing is carried out on a 32-channel digital Cadwell recorder, reformatted into 16-channel montages with 1 devoted to EKG. The patient was awake, but poorly responsive and drowsy during the recording.  The recording time 23-1/2 minutes.  DESCRIPTION OF FINDINGS:  Background activity consists of diffuse low- voltage polymorphic and semi-rhythmic lower theta, upper delta range activity.  The background is monotonous.  There is no interictal or ictal activity during the record.  In comparison with previous records, no seizure activity either clinical or electrographic was seen.  EKG showed regular sinus rhythm with ventricular response of 78 beats per minute.  IMPRESSION:  Abnormal EEG on the basis of severe generalized slowing and low voltage.  This is related to the patient's underlying static Encephalopathy and possibly medication effect.  In comparison with previous studies, however, no seizure activity was seen.     Deanna Artis. Sharene Skeans, M.D.    WUJ:WJXB D:  11/06/2012 08:43:19  T:  11/06/2012 21:52:50  Job #:  [id number]

## 2012-11-06 NOTE — Progress Notes (Signed)
Patient stable today so discharge. Instructions given to mom, assessments remained unchanged prior to discharge

## 2012-11-06 NOTE — Care Management Note (Signed)
    Page 1 of 1   11/06/2012     3:24:35 PM   CARE MANAGEMENT NOTE 11/06/2012  Patient:  Allison [name], Allison [name]   Account Number:  [id number]  Date Initiated:  10/30/2012  Documentation initiated by:  Jacquelynn Cree  Subjective/Objective Assessment:   Admitted with status epilepticus, hx of cp.     Action/Plan:   return home with mother   Anticipated DC Date:  11/06/2012   Anticipated DC Plan:  HOME/SELF CARE      DC Planning Services  CM consult      Choice offered to / List presented to:             Status of service:  Completed, signed off Medicare Important Message given?   (If response is "NO", the following Medicare IM given date fields will be blank) Date Medicare IM given:   Date Additional Medicare IM given:    Discharge Disposition:  HOME/SELF CARE  Per UR Regulation:  Reviewed for med. necessity/level of care/duration of stay  If discussed at Long Length of Stay Meetings, dates discussed:    Comments:

## 2012-11-10 LAB — CULTURE, BLOOD (ROUTINE X 2): Culture: NO GROWTH

## 2012-11-27 ENCOUNTER — Other Ambulatory Visit (HOSPITAL_COMMUNITY): Payer: Self-pay | Admitting: Pediatrics

## 2012-11-27 DIAGNOSIS — R569 Unspecified convulsions: Secondary | ICD-10-CM

## 2012-12-11 ENCOUNTER — Ambulatory Visit (HOSPITAL_COMMUNITY)
Admission: RE | Admit: 2012-12-11 | Discharge: 2012-12-11 | Disposition: A | Discharge status: Home / Self Care | Payer: 59 | Source: Ambulatory Visit | Attending: Pediatrics | Admitting: Pediatrics

## 2012-12-11 DIAGNOSIS — R9401 Abnormal electroencephalogram [EEG]: Secondary | ICD-10-CM | POA: Insufficient documentation

## 2012-12-11 DIAGNOSIS — G40219 Localization-related (focal) (partial) symptomatic epilepsy and epileptic syndromes with complex partial seizures, intractable, without status epilepticus: Secondary | ICD-10-CM | POA: Insufficient documentation

## 2012-12-11 DIAGNOSIS — G40319 Generalized idiopathic epilepsy and epileptic syndromes, intractable, without status epilepticus: Secondary | ICD-10-CM | POA: Insufficient documentation

## 2012-12-11 DIAGNOSIS — R569 Unspecified convulsions: Secondary | ICD-10-CM

## 2012-12-11 NOTE — Progress Notes (Signed)
EEG completed as outpatient as ordered

## 2012-12-12 NOTE — Procedures (Signed)
EEG NUMBER:  [id number].  CLINICAL HISTORY:  The patient is a 21 year old who had increasing seizure activity in the setting of fever.  EEG showed 16 events on the 25 minutes.  The patient was placed on Vimpat 50 mg 4 times daily, increased to 100 mg every 6 hours.  Dilantin was added to Vimpat with cessation of seizures.  At discharge, the patient was on Vimpat, Keppra, Dilantin, clonazepam, Felbatol, and Lyrica, and remains on those Medications. (345.11, 345.41)  PROCEDURE:  The tracing was carried out on a 32-channel digital Cadwell recorder, reformatted into 16-channel montages with 1 devoted to EKG. The patient was awake during the recording.  The international 10/20 system lead placement was used.  Recording time 21-1/2 minutes.  DESCRIPTION OF FINDINGS:  There was no dominant frequency.  The background activity shows 1-3 hertz 40-microvolt delta range activity that was semirhythmic and polymorphic.  There is T4 artifact, which was corrected.  Photic stimulation failed to induce the driving response.  Hyperventilation could not be carried out. EKG showed a sinus rhythm with ventricular response of 69 beats per minute.  IMPRESSION:  Abnormal EEG on the basis of profound background slowing, this is related to the patient's underlying static encephalopathy.  In comparison with the previous study, there is no seizure activity either interictal or ictal in the record.     Deanna Artis. Sharene Skeans, M.D.    ZOX:WRUE D:  12/11/2012 13:55:30  T:  12/12/2012 45:40:98  Job #:  [id number]

## 2013-04-26 ENCOUNTER — Other Ambulatory Visit: Payer: Self-pay | Admitting: Family

## 2013-04-26 DIAGNOSIS — G40219 Localization-related (focal) (partial) symptomatic epilepsy and epileptic syndromes with complex partial seizures, intractable, without status epilepticus: Secondary | ICD-10-CM

## 2013-04-26 DIAGNOSIS — G40319 Generalized idiopathic epilepsy and epileptic syndromes, intractable, without status epilepticus: Secondary | ICD-10-CM

## 2013-04-26 DIAGNOSIS — F72 Severe intellectual disabilities: Secondary | ICD-10-CM

## 2013-04-26 MED ORDER — CLONAZEPAM 0.5 MG PO TABS: ORAL_TABLET | ORAL | Status: DC

## 2013-04-26 MED ORDER — PREGABALIN 75 MG PO CAPS: ORAL_CAPSULE | ORAL | Status: DC

## 2013-06-04 ENCOUNTER — Encounter: Payer: Self-pay | Admitting: Family

## 2013-06-04 ENCOUNTER — Ambulatory Visit (INDEPENDENT_AMBULATORY_CARE_PROVIDER_SITE_OTHER): Payer: Medicaid Other | Admitting: Family

## 2013-06-04 VITALS — BP 94/64 | HR 82

## 2013-06-04 DIAGNOSIS — G40219 Localization-related (focal) (partial) symptomatic epilepsy and epileptic syndromes with complex partial seizures, intractable, without status epilepticus: Secondary | ICD-10-CM | POA: Insufficient documentation

## 2013-06-04 DIAGNOSIS — R569 Unspecified convulsions: Secondary | ICD-10-CM

## 2013-06-04 DIAGNOSIS — F72 Severe intellectual disabilities: Secondary | ICD-10-CM

## 2013-06-04 DIAGNOSIS — Z79899 Other long term (current) drug therapy: Secondary | ICD-10-CM

## 2013-06-04 DIAGNOSIS — Q02 Microcephaly: Secondary | ICD-10-CM

## 2013-06-04 DIAGNOSIS — G40319 Generalized idiopathic epilepsy and epileptic syndromes, intractable, without status epilepticus: Secondary | ICD-10-CM

## 2013-06-04 DIAGNOSIS — R6251 Failure to thrive (child): Secondary | ICD-10-CM

## 2013-06-04 DIAGNOSIS — M4150 Other secondary scoliosis, site unspecified: Secondary | ICD-10-CM

## 2013-06-04 DIAGNOSIS — G40901 Epilepsy, unspecified, not intractable, with status epilepticus: Secondary | ICD-10-CM

## 2013-06-04 DIAGNOSIS — R748 Abnormal levels of other serum enzymes: Secondary | ICD-10-CM | POA: Insufficient documentation

## 2013-06-04 DIAGNOSIS — G809 Cerebral palsy, unspecified: Secondary | ICD-10-CM

## 2013-06-04 DIAGNOSIS — G40401 Other generalized epilepsy and epileptic syndromes, not intractable, with status epilepticus: Secondary | ICD-10-CM

## 2013-06-04 NOTE — Progress Notes (Signed)
Patient: Allison [name] MRN: [id number] Sex: female DOB: December 16, 1991  Provider: Elveria Rising, NP Location of Care: [facility]  Note type: Routine return visit  History of Present Illness: Referral Source: Dr. Linward Headland History from: Mother Chief Complaint: Seizures  Allison [name] is a 22 y.o. 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 She was last seen January 08, 2013, for follow up for an admission to [facility] for an increase in seizures in November, 2013. At that time, a CT scan of the brain in November, 2013 showed severe central and cortical atrophy with hydrocephalus ex vacuo and microcephaly. She was admitted to [facility] October 27, 2012 through November 06, 2012, for a precipitous increase in seizures.  Initial EEG showed 16 electrographic and clinical seizures in the 25-minute.  The patient was admitted on Keppra, clonazepam, Felbatol, and Lyrica.  Vimpat and Dilantin were added with slowing and then regression of seizures. She had an EEG in December, 2013 that revealed background slowing but no seizure or interictal activity.   Today Allison [name]'s mother tells me that she has been doing well since January. She has returned to her usual frequency of 1 or 2 brief seizures per day. She has been healthy and alert. Allison [name] will be graduating from [employer] later this week. Her mother plans to enroll her in the [employer] program.    Review of Systems: 12 system review was remarkable for seizures  Past Medical History  Diagnosis Date  . Seizures   . 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 Patient has had several hospitalizations due to surgeries and seizure activity.  Surgical History Past Surgical History  Procedure Laterality Date  . Back surgery    . Hip surgery      bilateral   Surgeries: yes Surgical History Comments: PEG tube placement in July 2010, hip surgery, spinal fusion.  Family History family history includes Kidney cancer in her maternal grandfather and Ovarian cancer in her maternal grandmother. Family History is negative migraines, seizures, cognitive impairment, blindness, deafness, birth defects, chromosomal disorder, autism.  Social History History   Social History  . Marital Status: Single    Spouse Name: N/A    Number of Children: N/A  . Years of Education: N/A   Social History Main Topics  . Smoking status: Never Smoker   . Smokeless tobacco: None  . Alcohol Use: No  . Drug Use: No  . Sexually Active: No   Other Topics Concern  . None   Social History Narrative   She is disabled and lives with her mother and sister. She has attended [employer] but will be graduating in June 2014. Her mother plans to enroll her in the [employer] program.    Physical Exam BP 94/64  Pulse 82 General: microcephalic child lying in a wheelchair, chronically ill, thin with wasting of her limbs. She has a PEG feeding tube in place. Head: microcephalic and atraumatic.  Right tympanic membrane is partially obstructed by cerumen but is otherwise pearly grey with light reflex. Left tympanic membrane is pearly grey with bony landmarks visualized. Ears, Nose and Throat: I am unable to see in her pharynx  Neck: supple with no carotid or supraclavicular bruits. Respiratory: lungs clear to auscultation.  She has an intermittent nonproductive cough. Cardiovascular: regular rate and rhythm, no murmurs. Musculoskeletal: She has contractures in her elbows, shoulders,  wrists, knees, hips, and tight heel cords.  She has treated scoliosis.  Skin: She has acneform rash on her nose and cheeks    Neurologic Exam  Mental Status: Profound cognitive impairment.  The patient has no language.  She did attempt to resist examination. She was smiling and responsive to her mother.  Cranial Nerves: The patient has reactive pupils, possible roving left eye movement.  Her extraocular movements were full but eyelids were not always conjugate.  The patient has ability to close her eyes and her lips.  Her tongue is midline.  She did not apparently fix and follow    She occasionally startles to sound. Motor: Shows spastic quadriparesis without sparing of her limbs.  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  strength is 3/5 proximally 1/5 distally in her arms, 2/5 in her legs.  She has rigid spastic quadriparesis.  She has flexion at the elbows.  She cannot extend her wrists past neutral position.  She can nearly extend her fingers. Sensory: Withdrawal x 4. Coordination: Poor coordination with non-purposeful movements Gait and Station: She is wheelchair bound. Reflexes:  Reflexes were brisk.  She had sustained ankle clonus.   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 She is taking and tolerating Phenytoin, Vimpat, Clonazepam, Lyrica, Felbatol and Levetiracetam. Her seizures are stable at this time and I will make no changes in her treatment plan. I will see her back in 6 months or sooner if needed.

## 2013-06-04 NOTE — Patient Instructions (Signed)
Continue Winnifred's medications without change. Call if her seizures increase in frequency or severity.  Plan to return for follow up in 6 months or sooner if needed.

## 2013-06-05 ENCOUNTER — Encounter: Payer: Self-pay | Admitting: Family

## 2013-06-17 ENCOUNTER — Other Ambulatory Visit (HOSPITAL_COMMUNITY): Payer: Self-pay | Admitting: Pediatrics

## 2013-06-27 ENCOUNTER — Other Ambulatory Visit: Payer: Self-pay | Admitting: Family

## 2013-07-03 ENCOUNTER — Other Ambulatory Visit: Payer: Self-pay | Admitting: Family

## 2013-07-03 DIAGNOSIS — G40219 Localization-related (focal) (partial) symptomatic epilepsy and epileptic syndromes with complex partial seizures, intractable, without status epilepticus: Secondary | ICD-10-CM

## 2013-07-03 DIAGNOSIS — G40319 Generalized idiopathic epilepsy and epileptic syndromes, intractable, without status epilepticus: Secondary | ICD-10-CM

## 2013-07-22 ENCOUNTER — Other Ambulatory Visit: Payer: Self-pay | Admitting: Family

## 2013-07-23 ENCOUNTER — Other Ambulatory Visit: Payer: Self-pay | Admitting: Family

## 2013-07-23 DIAGNOSIS — G40219 Localization-related (focal) (partial) symptomatic epilepsy and epileptic syndromes with complex partial seizures, intractable, without status epilepticus: Secondary | ICD-10-CM

## 2013-07-23 DIAGNOSIS — G40319 Generalized idiopathic epilepsy and epileptic syndromes, intractable, without status epilepticus: Secondary | ICD-10-CM

## 2013-07-23 MED ORDER — LEVETIRACETAM 1000 MG PO TABS: ORAL_TABLET | ORAL | Status: DC

## 2013-07-31 ENCOUNTER — Other Ambulatory Visit: Payer: Self-pay | Admitting: Family

## 2013-10-08 ENCOUNTER — Telehealth: Payer: Self-pay

## 2013-10-08 NOTE — Telephone Encounter (Signed)
I called and spoke with mom. Let her know the FMLA paperwork was ready for pick up and that it would be up front for her.

## 2013-12-04 ENCOUNTER — Ambulatory Visit: Payer: Medicare Other | Admitting: Family

## 2013-12-31 ENCOUNTER — Other Ambulatory Visit (HOSPITAL_COMMUNITY): Payer: Self-pay | Admitting: Family

## 2014-01-10 ENCOUNTER — Telehealth: Payer: Self-pay

## 2014-01-10 ENCOUNTER — Other Ambulatory Visit: Payer: Self-pay | Admitting: Family

## 2014-01-10 DIAGNOSIS — G40901 Epilepsy, unspecified, not intractable, with status epilepticus: Secondary | ICD-10-CM

## 2014-01-10 DIAGNOSIS — G40309 Generalized idiopathic epilepsy and epileptic syndromes, not intractable, without status epilepticus: Secondary | ICD-10-CM

## 2014-01-10 DIAGNOSIS — G40219 Localization-related (focal) (partial) symptomatic epilepsy and epileptic syndromes with complex partial seizures, intractable, without status epilepticus: Secondary | ICD-10-CM

## 2014-01-10 NOTE — Telephone Encounter (Signed)
I spoke w Allison [name] and she stated that pt has enough Vimpat to last her until Tuesday. I told her that if we do not get the PA from the ins company that we will give her some more samples and that it was important not to let her run out. She expressed understanding.

## 2014-01-10 NOTE — Telephone Encounter (Signed)
Allison [name], mom, lvm stating that pharmacy told her that the pt's Vimpat needs authorization. She said that they told her they still have not received the refill authorization on the Clonazepam. I called mom to let her know that we just sent the refill authorizations for the Rx's and that I will look into the issue w the Vimpat. I spoke w [name] and she has submitted the paperwork to the insurance for PA on the Vimpat. I called mom and lvm letting her know and asked her to call me back so that I may find out if pt has enough Vimpat at home while waiting for the approval from the ins. If not, we have samples that she can come get at the office.

## 2014-01-13 MED ORDER — VIMPAT 200 MG PO TABS: ORAL_TABLET | ORAL | Status: DC

## 2014-01-13 NOTE — Telephone Encounter (Signed)
Faxed Rx as requested. Called Allison BridgeMartha and informed her of the below information.

## 2014-01-13 NOTE — Telephone Encounter (Signed)
I received a notice from [employer] insurance today that they will not cover the Vimpat 100mg  because of the quantity required. They will cover Vimpat 200mg , 1/2 tablet every 6 hours. I have printed a prescription for her for that. Please let Mom know and send the Vimpat 200mg  Rx to the pharmacy. Thanks, [name]

## 2014-01-20 ENCOUNTER — Ambulatory Visit (INDEPENDENT_AMBULATORY_CARE_PROVIDER_SITE_OTHER): Payer: Medicare Other | Admitting: Family

## 2014-01-20 ENCOUNTER — Encounter: Payer: Self-pay | Admitting: Family

## 2014-01-20 VITALS — BP 90/60 | HR 80 | Wt <= 1120 oz

## 2014-01-20 DIAGNOSIS — F72 Severe intellectual disabilities: Secondary | ICD-10-CM

## 2014-01-20 DIAGNOSIS — Z79899 Other long term (current) drug therapy: Secondary | ICD-10-CM

## 2014-01-20 DIAGNOSIS — G40219 Localization-related (focal) (partial) symptomatic epilepsy and epileptic syndromes with complex partial seizures, intractable, without status epilepticus: Secondary | ICD-10-CM

## 2014-01-20 DIAGNOSIS — G809 Cerebral palsy, unspecified: Secondary | ICD-10-CM

## 2014-01-20 DIAGNOSIS — G40319 Generalized idiopathic epilepsy and epileptic syndromes, intractable, without status epilepticus: Secondary | ICD-10-CM

## 2014-01-20 DIAGNOSIS — G40901 Epilepsy, unspecified, not intractable, with status epilepticus: Secondary | ICD-10-CM

## 2014-01-20 DIAGNOSIS — R6251 Failure to thrive (child): Secondary | ICD-10-CM

## 2014-01-20 DIAGNOSIS — Q02 Microcephaly: Secondary | ICD-10-CM

## 2014-01-20 DIAGNOSIS — M4150 Other secondary scoliosis, site unspecified: Secondary | ICD-10-CM

## 2014-01-20 DIAGNOSIS — R569 Unspecified convulsions: Secondary | ICD-10-CM

## 2014-01-20 DIAGNOSIS — G40401 Other generalized epilepsy and epileptic syndromes, not intractable, with status epilepticus: Secondary | ICD-10-CM

## 2014-01-20 NOTE — Progress Notes (Signed)
Patient: Allison [name] MRN: [id number] Sex: female DOB: 11/02/1991  Provider: Elveria Rising, NP Location of Care: [facility]  Note type: Routine return visit  History of Present Illness: Referral Source: Dr. Linward Headland History from: her mother Chief Complaint: Seizures  Allison [name] is a 23 y.o. 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 She was admitted to [facility] October 27, 2012 through November 06, 2012, for a precipitous increase in seizures. Initial EEG showed 16 electrographic and clinical seizures in the 25-minute. A CT scan of the brain at that time showed severe central and cortical atrophy with hydrocephalus ex vacuo and microcephaly.The patient was admitted on Keppra, clonazepam, Felbatol, and Lyrica. Vimpat and Dilantin were added with slowing and then regression of seizures. She had an EEG in December, 2013 that revealed background slowing but no seizure or interictal activity.   Today Allison [name]'s mother tells me that she has been doing well since she was last seen in June, 2014. She has returned to her usual frequency of 1 or 2 brief seizures per day. She has been healthy and alert. Allison [name] has been attending the [employer] day program   Review of Systems: 12 system review was remarkable for seizure  Past Medical History  Diagnosis Date  . Seizures   . 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 Patient has had several hospitalizations due to surgeries and seizure activity. She was last hospitalized in Nov. 2014 due to seizure  activity.  Surgical History Past Surgical History  Procedure Laterality Date  . Back surgery      spinal fusion  . Hip surgery      bilateral  . Peg placement  July 2010    Family History family history includes Kidney cancer in her maternal grandfather; Ovarian cancer in her maternal grandmother. Family History is negative migraines, seizures, cognitive impairment, blindness, deafness, birth defects, chromosomal disorder, autism.  Social History History   Social History  . Marital Status: Single    Spouse Name: N/A    Number of Children: N/A  . Years of Education: N/A   Social History Main Topics  . Smoking status: Never Smoker   . Smokeless tobacco: Never Used  . Alcohol Use: No  . Drug Use: No  . Sexual Activity: No   Other Topics Concern  . None   Social History Narrative   She is disabled and lives with her mother and sister. She has attended [employer] and graduated in June 2014. She now attends the [employer].   Educational level: special education Occupation: N/A   Living with mother and sister  Hobbies/Interest: Enjoys listening to music, watching movies and going to amusement parks. School comments: Allison [name] graduated from [employer] in June 2014. She is currently attending [employer] day program.  Current Outpatient Prescriptions on File Prior to Visit  Medication Sig Dispense Refill  . clonazePAM (KLONOPIN) 0.5 MG tablet TAKE 1 TABLET BY MOUTH THREE TIMES DAILY  93 tablet  5  . felbamate (FELBATOL) 600 MG/5ML suspension TAKE 5 MLS BY MOUTH TWICE DAILY  310 mL  5  . ibuprofen (ADVIL,MOTRIN) 100 MG/5ML suspension Give 10 ml per G-tube every 4-6  hours PRN fever      . levETIRAcetam (KEPPRA) 1000 MG tablet Give 1+ 1/2 tablets in the morning and 1 tablet at night. Crush and put in G-tube  80 tablet  5  . LYRICA 75 MG capsule TAKE ONE CAPSULE BY MOUTH EVERY DAY  30 capsule  5  . PHENYTOIN INFATABS 50 MG tablet CHEW AND SWALLOW  1 TABLET BY MOUTH EVERY 8 HOURS  93 tablet  5  . VIMPAT 200 MG TABS tablet Give 1/2 tablet every 6 hours via gastric tube  60 tablet  5   No current facility-administered medications on file prior to visit.   No Known Allergies  Physical Exam BP 90/60  Pulse 80  Wt 66 lb (29.937 kg) General: microcephalic child lying in a wheelchair, chronically ill, thin with wasting of her limbs. She has a PEG feeding tube in place.  Head: microcephalic and atraumatic. Right tympanic membrane is partially obstructed by cerumen but is otherwise pearly grey with light reflex. Left tympanic membrane is pearly grey with bony landmarks visualized.  Ears, Nose and Throat: I am unable to see in her pharynx  Neck: supple with no carotid or supraclavicular bruits.  Respiratory: lungs clear to auscultation. She has an intermittent nonproductive cough.  Cardiovascular: regular rate and rhythm, no murmurs.  Musculoskeletal: She has contractures in her elbows, shoulders, wrists, knees, hips, and tight heel cords. She has treated scoliosis.  Skin: She has acneform rash on her nose and cheeks   Neurologic Exam  Mental Status: Profound cognitive impairment. The patient has no language. She did attempt to resist examination. She was smiling and responsive to her mother.  Cranial Nerves: The patient has reactive pupils, possible roving left eye movement. Her extraocular movements were full but eyelids were not always conjugate. The patient has ability to close her eyes and her lips. Her tongue is midline. She did not apparently fix and follow She occasionally startles to sound.  Motor: Shows spastic quadriparesis without sparing of her limbs. 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 strength is 3/5 proximally 1/5 distally in her arms, 2/5 in her legs. She has rigid spastic quadriparesis. She has flexion at the elbows. She cannot extend her wrists past neutral position. She can nearly extend her fingers.  Sensory: Withdrawal x  4.  Coordination: Poor coordination with non-purposeful movements  Gait and Station: She is wheelchair bound.  Reflexes: Reflexes were brisk. She had sustained ankle clonus.   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 She is taking and tolerating Phenytoin, Vimpat, Clonazepam, Lyrica, Felbatol and Levetiracetam. Her seizures are stable at this time and I will make no changes in her treatment plan. I will see her back in 6 months or sooner if needed.

## 2014-01-22 ENCOUNTER — Encounter: Payer: Self-pay | Admitting: Family

## 2014-01-22 NOTE — Patient Instructions (Signed)
Allison [name]'s medications without change. Let me know if her seizures increase in frequency or severity.  Please plan to return for follow up in 6 months or sooner if needed.

## 2014-02-05 ENCOUNTER — Other Ambulatory Visit: Payer: Self-pay | Admitting: Family

## 2014-03-31 ENCOUNTER — Telehealth: Payer: Self-pay

## 2014-03-31 NOTE — Telephone Encounter (Signed)
Levora DredgeBeth Clark, [facility] lvm stating that they were providing a CNA to provide tube feedings for the pt. Mom has changed the time of feedings to a later time in the evening and is also now providing the tube feedings herself. [facility] needs an order so that they can dc the tube feedings on their end of things. Please call Beth at [phone].

## 2014-04-01 NOTE — Telephone Encounter (Signed)
I called [facility]. They said that since Mom was now doing tube feedings that they would send an order cancelling their nursing services. They will still be available to Mom if she needs help in the future. TG

## 2014-07-09 ENCOUNTER — Other Ambulatory Visit: Payer: Self-pay | Admitting: Family

## 2014-07-21 ENCOUNTER — Other Ambulatory Visit (HOSPITAL_COMMUNITY): Payer: Self-pay | Admitting: Family

## 2014-07-23 ENCOUNTER — Ambulatory Visit (INDEPENDENT_AMBULATORY_CARE_PROVIDER_SITE_OTHER): Payer: Medicare Other | Admitting: Family

## 2014-07-23 ENCOUNTER — Encounter: Payer: Self-pay | Admitting: Family

## 2014-07-23 VITALS — BP 92/58 | HR 82

## 2014-07-23 DIAGNOSIS — G40319 Generalized idiopathic epilepsy and epileptic syndromes, intractable, without status epilepticus: Secondary | ICD-10-CM

## 2014-07-23 DIAGNOSIS — G40401 Other generalized epilepsy and epileptic syndromes, not intractable, with status epilepticus: Secondary | ICD-10-CM

## 2014-07-23 DIAGNOSIS — G40901 Epilepsy, unspecified, not intractable, with status epilepticus: Secondary | ICD-10-CM

## 2014-07-23 DIAGNOSIS — Q02 Microcephaly: Secondary | ICD-10-CM

## 2014-07-23 DIAGNOSIS — M4150 Other secondary scoliosis, site unspecified: Secondary | ICD-10-CM

## 2014-07-23 DIAGNOSIS — F72 Severe intellectual disabilities: Secondary | ICD-10-CM

## 2014-07-23 DIAGNOSIS — Z79899 Other long term (current) drug therapy: Secondary | ICD-10-CM

## 2014-07-23 DIAGNOSIS — G40219 Localization-related (focal) (partial) symptomatic epilepsy and epileptic syndromes with complex partial seizures, intractable, without status epilepticus: Secondary | ICD-10-CM

## 2014-07-23 DIAGNOSIS — R6251 Failure to thrive (child): Secondary | ICD-10-CM

## 2014-07-23 DIAGNOSIS — G809 Cerebral palsy, unspecified: Secondary | ICD-10-CM

## 2014-07-23 NOTE — Patient Instructions (Signed)
Allison [name] needs to have blood drawn to check her blood counts and liver function, as well as her Felbatol and Phenytoin levels. Please have the blood drawn in the early morning, before her first dose of medication of the day. Take the Felbatol and Phenytoin medications with you and give it to her after the blood has been drawn.  You can give her breakfast and liquids before she goes to the lab, as well as her other morning medications, just not the Felbatol or Phenytoin (until after the blood has been drawn).  I will call you after I have received the blood test results.  Please continue her medications as usual for now.  Call me if Allison [name]'s seizures increase in frequency or severity, or if you have other concerns.  Try to elevate her legs at times during the day. Monitor the swelling around her ankles. She may need to wear knee high elastic stockings if the swelling continues.  Please plan to return for follow up in 6 months or sooner if needed.

## 2014-07-23 NOTE — Progress Notes (Signed)
Patient: Allison [name] MRN: [id number] Sex: female DOB: [birth year]  Provider: Elveria Rising, NP Location of Care: [facility]  Note type: Routine return visit  History of Present Illness: Referral Source: Allison [name] History from: [name] Chief Complaint: Seizures  Allison [name] is a 23 y.o. 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 She was last seen January 20, 2014. Allison [name] was admitted to [facility] October 27, 2012 through November 06, 2012, for a precipitous increase in seizures. Initial EEG showed 16 electrographic and clinical seizures in the 25-minute. A CT scan of the brain at that time showed severe central and cortical atrophy with hydrocephalus ex vacuo and microcephaly.The patient was admitted on Keppra, clonazepam, Felbamate, and Lyrica. Vimpat and Phenytoin were added with slowing and then regression of seizures. She had an EEG in December, 2013 that revealed background slowing but no seizure or interictal activity. She has continued on these medications, and has tolerated them well. She has 1 or 2 brief seizures per day that consist of twitches in the muscles of her face. Sometimes her head will turn slightly or her eyes will deviate to the left. These seizures last only about 2-3 seconds.   Today Allison [name] tells me that she has been doing well since she was last seen. She notices a slight increase in seizure frequency when Allison [name] has not slept well the night before. She said that Allison [name] has had a few more seizures in the last 2 days for reasons that are unclear to her. She had one seizure in the exam room this morning in which she had a facial grimace primarily on the left side of her face, deviation of her eyes to the left and some brief twitching of her cheek and eye muscles for about 3 seconds.  Afterwards, she remained awake and alert. Allison [name] has an occasional loose cough, but has not had fever. She has been otherwise well.   Review of Systems: 12 system review was unremarkable  Past Medical History  Diagnosis Date  . Seizures   . Cerebral palsy    Hospitalizations: No., Head Injury: No., Nervous System Infections: No., Immunizations up to date: Yes.   Past Medical History Comments: see Hx.  Surgical History Past Surgical History  Procedure Laterality Date  . Back surgery      spinal fusion  . Hip surgery      bilateral  . Peg placement  July 2010    Family History family history includes Kidney cancer in her maternal grandfather; Ovarian cancer in her maternal grandmother. Family History is otherwise negative for migraines, seizures, cognitive impairment, blindness, deafness, birth defects, chromosomal disorder, autism.  Social History History   Social History  . Marital Status: Single    Spouse Name: N/A    Number of Children: N/A  . Years of Education: N/A   Social History Main Topics  . Smoking status: Never Smoker   . Smokeless tobacco: Never Used  . Alcohol Use: No  . Drug Use: No  . Sexual Activity: No   Other Topics Concern  . None   Social History Narrative   She is disabled and lives with [name] and sister. She has attended [employer] and graduated in June 2014. She used to attend [employer], but [name] can no longer afford that program. [name] as applied for funding for a day  program and is hoping that she will be accepted for the fall of 2015.   Educational level: 12th grade School Attending:N/A Living with:  [name] and sister Hobbies/Interest: music School comments:  Shirlie graduated from [employer] in 2014.  Physical Exam BP 92/58  Pulse 82  LMP 05/08/2014 General: microcephalic child lying in a wheelchair, chronically ill, thin with wasting of her limbs. She has a PEG feeding  tube in place.  Head: microcephalic and atraumatic. Right tympanic membrane is partially obstructed by cerumen but is otherwise pearly grey with light reflex. Left tympanic membrane is pearly grey with bony landmarks visualized.  Ears, Nose and Throat: I am unable to see in her pharynx  Neck: supple with no carotid or supraclavicular bruits.  Respiratory: lungs clear to auscultation. She has an intermittent nonproductive cough.  Cardiovascular: regular rate and rhythm, no murmurs.  Musculoskeletal: She has contractures in her elbows, shoulders, wrists, knees, hips, and tight heel cords. She has treated scoliosis.  Skin: She has acneform rash on her nose and cheeks   Neurologic Exam  Mental Status: Profound cognitive impairment. The patient has no language. She did attempt to resist examination. She was smiling and responsive to [name].  Cranial Nerves: The patient has reactive pupils, possible roving left eye movement. Her extraocular movements were full but eyelids were not always conjugate. The patient has ability to close her eyes and her lips. Her tongue is midline. She did not apparently fix and follow She occasionally startles to sound.  Motor: Shows spastic quadriparesis without sparing of her limbs. 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 strength is 3/5 proximally 1/5 distally in her arms, 2/5 in her legs. She has rigid spastic quadriparesis. She has flexion at the elbows. She cannot extend her wrists past neutral position. She can nearly extend her fingers.  Sensory: Withdrawal x 4.  Coordination: Poor coordination with non-purposeful movements  Gait and Station: She is wheelchair bound.  Reflexes: Reflexes were brisk. She had sustained ankle clonus.  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 She is  taking and tolerating Phenytoin, Vimpat, Clonazepam, Lyrica, Felbamate and Levetiracetam. She has had a few more brief seizures recently. I talked with [name] about the seizures. Allison [name] has not had blood drawn to check her CBC and ALT in some time, and as she is taking Felbamate and Phenytoin, these blood tests should be done. In addition, since she has been having a slight increase in seizures, it would be worthwhile to check these levels. [name] agreed and will take her to have blood drawn in the near future. I will make no changes in her treatment plan at this time. Depending on her lab results and whether or not her seizure frequency continues to be increased, I may recommend an increase in the future. I will call [name] when I receive the lab report. I will otherwise see her back in 6 months or sooner if needed.

## 2014-08-07 ENCOUNTER — Other Ambulatory Visit: Payer: Self-pay | Admitting: Family

## 2014-08-08 ENCOUNTER — Other Ambulatory Visit: Payer: Self-pay | Admitting: Family

## 2014-08-09 LAB — CBC WITH DIFFERENTIAL/PLATELET

## 2014-08-09 LAB — PHENYTOIN LEVEL, TOTAL: Phenytoin Lvl: 16.9 ug/mL (ref 10.0–20.0)

## 2014-08-09 LAB — FELBAMATE LEVEL: FELBAMATE LVL: 27 ug/mL — AB (ref 40–100)

## 2014-08-09 LAB — ALT: ALT: 81 IU/L — AB (ref 0–32)

## 2014-08-14 ENCOUNTER — Telehealth: Payer: Self-pay | Admitting: *Deleted

## 2014-08-14 NOTE — Telephone Encounter (Signed)
I called [facility] and cancelled the CBC order. TG

## 2014-08-14 NOTE — Telephone Encounter (Signed)
Tess, from [facility], stated they were able to draw all of the lab tests ordered except for the cbc. They were not able to test the cbc that was drawn and the pt needs to be redrawn. If you want the pt to be drawn again for the cbc, you can call [phone].

## 2014-08-19 NOTE — Addendum Note (Signed)
Addended by: Princella Ion on: 08/19/2014 12:16 PM   Modules accepted: Orders

## 2014-10-10 ENCOUNTER — Telehealth: Payer: Self-pay | Admitting: *Deleted

## 2014-10-10 NOTE — Telephone Encounter (Signed)
I called Mom and told her that she could pick up the FMLA form. TG

## 2014-10-10 NOTE — Telephone Encounter (Signed)
The mother stated [facility]. She would like to know if the paperwork is completed. The mother can be reached at [phone].

## 2014-11-28 ENCOUNTER — Inpatient Hospital Stay (HOSPITAL_COMMUNITY)
Admission: EM | Admit: 2014-11-28 | Discharge: 2014-12-26 | DRG: 871 | Disposition: E | Payer: Medicaid Other | Attending: Pulmonary Disease | Admitting: Pulmonary Disease

## 2014-11-28 ENCOUNTER — Encounter (HOSPITAL_COMMUNITY): Payer: Self-pay | Admitting: Emergency Medicine

## 2014-11-28 ENCOUNTER — Emergency Department (HOSPITAL_COMMUNITY): Payer: Medicaid Other

## 2014-11-28 DIAGNOSIS — D709 Neutropenia, unspecified: Secondary | ICD-10-CM

## 2014-11-28 DIAGNOSIS — J9601 Acute respiratory failure with hypoxia: Secondary | ICD-10-CM | POA: Diagnosis present

## 2014-11-28 DIAGNOSIS — R6521 Severe sepsis with septic shock: Secondary | ICD-10-CM | POA: Diagnosis present

## 2014-11-28 DIAGNOSIS — Z993 Dependence on wheelchair: Secondary | ICD-10-CM

## 2014-11-28 DIAGNOSIS — R0602 Shortness of breath: Secondary | ICD-10-CM

## 2014-11-28 DIAGNOSIS — A419 Sepsis, unspecified organism: Principal | ICD-10-CM | POA: Diagnosis present

## 2014-11-28 DIAGNOSIS — Z452 Encounter for adjustment and management of vascular access device: Secondary | ICD-10-CM

## 2014-11-28 DIAGNOSIS — R062 Wheezing: Secondary | ICD-10-CM

## 2014-11-28 DIAGNOSIS — G40909 Epilepsy, unspecified, not intractable, without status epilepticus: Secondary | ICD-10-CM | POA: Diagnosis present

## 2014-11-28 DIAGNOSIS — Z931 Gastrostomy status: Secondary | ICD-10-CM

## 2014-11-28 DIAGNOSIS — D696 Thrombocytopenia, unspecified: Secondary | ICD-10-CM

## 2014-11-28 DIAGNOSIS — R0902 Hypoxemia: Secondary | ICD-10-CM

## 2014-11-28 DIAGNOSIS — G809 Cerebral palsy, unspecified: Secondary | ICD-10-CM | POA: Diagnosis present

## 2014-11-28 DIAGNOSIS — B951 Streptococcus, group B, as the cause of diseases classified elsewhere: Secondary | ICD-10-CM | POA: Diagnosis present

## 2014-11-28 DIAGNOSIS — E871 Hypo-osmolality and hyponatremia: Secondary | ICD-10-CM

## 2014-11-28 DIAGNOSIS — R739 Hyperglycemia, unspecified: Secondary | ICD-10-CM | POA: Diagnosis present

## 2014-11-28 DIAGNOSIS — J80 Acute respiratory distress syndrome: Secondary | ICD-10-CM | POA: Diagnosis present

## 2014-11-28 DIAGNOSIS — J9600 Acute respiratory failure, unspecified whether with hypoxia or hypercapnia: Secondary | ICD-10-CM | POA: Diagnosis present

## 2014-11-28 DIAGNOSIS — J189 Pneumonia, unspecified organism: Secondary | ICD-10-CM

## 2014-11-28 DIAGNOSIS — R001 Bradycardia, unspecified: Secondary | ICD-10-CM | POA: Diagnosis present

## 2014-11-28 DIAGNOSIS — R140 Abdominal distension (gaseous): Secondary | ICD-10-CM

## 2014-11-28 DIAGNOSIS — R680 Hypothermia, not associated with low environmental temperature: Secondary | ICD-10-CM | POA: Diagnosis present

## 2014-11-28 DIAGNOSIS — D61818 Other pancytopenia: Secondary | ICD-10-CM | POA: Diagnosis present

## 2014-11-28 DIAGNOSIS — D689 Coagulation defect, unspecified: Secondary | ICD-10-CM | POA: Diagnosis present

## 2014-11-28 DIAGNOSIS — J45909 Unspecified asthma, uncomplicated: Secondary | ICD-10-CM | POA: Diagnosis present

## 2014-11-28 DIAGNOSIS — Z66 Do not resuscitate: Secondary | ICD-10-CM | POA: Diagnosis present

## 2014-11-28 HISTORY — DX: Unspecified asthma, uncomplicated: J45.909

## 2014-11-28 HISTORY — DX: Pneumonia, unspecified organism: J18.9

## 2014-11-28 MED ORDER — SUCCINYLCHOLINE CHLORIDE 20 MG/ML IJ SOLN
INTRAMUSCULAR | Status: DC | PRN
  Administered 2014-11-28: 70 mg via INTRAVENOUS

## 2014-11-28 MED ORDER — ETOMIDATE 2 MG/ML IV SOLN
INTRAVENOUS | Status: DC | PRN
  Administered 2014-11-28: 10 mg via INTRAVENOUS

## 2014-11-28 NOTE — ED Notes (Signed)
Pt.'s family reported progressing SOB / wheezing  onset this morning unrelieved by nebulizer treatment at home .

## 2014-11-28 NOTE — ED Notes (Signed)
Unable to get IV access at this time

## 2014-11-28 NOTE — ED Notes (Signed)
Dr. Hyacinth MeekerMiller attempting IV US at this time. Preparing patient for intubation.

## 2014-11-28 NOTE — ED Notes (Signed)
IV team at bedside 

## 2014-11-28 NOTE — ED Notes (Signed)
Family at bedside. Family being escorted to consultation room. Chaplin paged.

## 2014-11-29 ENCOUNTER — Inpatient Hospital Stay (HOSPITAL_COMMUNITY): Payer: Medicaid Other

## 2014-11-29 DIAGNOSIS — R6521 Severe sepsis with septic shock: Secondary | ICD-10-CM | POA: Diagnosis present

## 2014-11-29 DIAGNOSIS — A419 Sepsis, unspecified organism: Principal | ICD-10-CM

## 2014-11-29 DIAGNOSIS — J45909 Unspecified asthma, uncomplicated: Secondary | ICD-10-CM | POA: Diagnosis present

## 2014-11-29 DIAGNOSIS — Z931 Gastrostomy status: Secondary | ICD-10-CM | POA: Diagnosis not present

## 2014-11-29 DIAGNOSIS — G40909 Epilepsy, unspecified, not intractable, without status epilepticus: Secondary | ICD-10-CM | POA: Diagnosis present

## 2014-11-29 DIAGNOSIS — D689 Coagulation defect, unspecified: Secondary | ICD-10-CM | POA: Diagnosis present

## 2014-11-29 DIAGNOSIS — J80 Acute respiratory distress syndrome: Secondary | ICD-10-CM | POA: Diagnosis present

## 2014-11-29 DIAGNOSIS — B951 Streptococcus, group B, as the cause of diseases classified elsewhere: Secondary | ICD-10-CM | POA: Diagnosis present

## 2014-11-29 DIAGNOSIS — D61818 Other pancytopenia: Secondary | ICD-10-CM | POA: Diagnosis present

## 2014-11-29 DIAGNOSIS — J9601 Acute respiratory failure with hypoxia: Secondary | ICD-10-CM | POA: Diagnosis present

## 2014-11-29 DIAGNOSIS — E871 Hypo-osmolality and hyponatremia: Secondary | ICD-10-CM

## 2014-11-29 DIAGNOSIS — R680 Hypothermia, not associated with low environmental temperature: Secondary | ICD-10-CM | POA: Diagnosis present

## 2014-11-29 DIAGNOSIS — J189 Pneumonia, unspecified organism: Secondary | ICD-10-CM | POA: Diagnosis present

## 2014-11-29 DIAGNOSIS — D709 Neutropenia, unspecified: Secondary | ICD-10-CM

## 2014-11-29 DIAGNOSIS — R001 Bradycardia, unspecified: Secondary | ICD-10-CM | POA: Diagnosis present

## 2014-11-29 DIAGNOSIS — J9600 Acute respiratory failure, unspecified whether with hypoxia or hypercapnia: Secondary | ICD-10-CM | POA: Diagnosis present

## 2014-11-29 DIAGNOSIS — D696 Thrombocytopenia, unspecified: Secondary | ICD-10-CM

## 2014-11-29 DIAGNOSIS — Z66 Do not resuscitate: Secondary | ICD-10-CM | POA: Diagnosis present

## 2014-11-29 DIAGNOSIS — Z993 Dependence on wheelchair: Secondary | ICD-10-CM | POA: Diagnosis not present

## 2014-11-29 DIAGNOSIS — G809 Cerebral palsy, unspecified: Secondary | ICD-10-CM | POA: Diagnosis present

## 2014-11-29 DIAGNOSIS — R0602 Shortness of breath: Secondary | ICD-10-CM | POA: Diagnosis present

## 2014-11-29 DIAGNOSIS — R739 Hyperglycemia, unspecified: Secondary | ICD-10-CM | POA: Diagnosis present

## 2014-11-29 LAB — CBC
HCT: 28.4 % — ABNORMAL LOW (ref 36.0–46.0)
HEMATOCRIT: 29.5 % — AB (ref 36.0–46.0)
Hemoglobin: 10.1 g/dL — ABNORMAL LOW (ref 12.0–15.0)
Hemoglobin: 9.8 g/dL — ABNORMAL LOW (ref 12.0–15.0)
MCH: 32.6 pg (ref 26.0–34.0)
MCH: 32.8 pg (ref 26.0–34.0)
MCHC: 34.2 g/dL (ref 30.0–36.0)
MCHC: 34.5 g/dL (ref 30.0–36.0)
MCV: 95.2 fL (ref 78.0–100.0)
MCV: 95 fL (ref 78.0–100.0)
PLATELETS: 22 10*3/uL — AB (ref 150–400)
PLATELETS: 112 10*3/uL — AB (ref 150–400)
RBC: 3.1 MIL/uL — ABNORMAL LOW (ref 3.87–5.11)
RBC: 2.99 MIL/uL — AB (ref 3.87–5.11)
RDW: 13.8 % (ref 11.5–15.5)
RDW: 13.9 % (ref 11.5–15.5)
WBC: 1.9 10*3/uL — ABNORMAL LOW (ref 4.0–10.5)
WBC: 2.2 10*3/uL — AB (ref 4.0–10.5)

## 2014-11-29 LAB — MAGNESIUM
Magnesium: 1.3 mg/dL — ABNORMAL LOW (ref 1.5–2.5)
Magnesium: 3.1 mg/dL — ABNORMAL HIGH (ref 1.5–2.5)

## 2014-11-29 LAB — POCT I-STAT 3, ART BLOOD GAS (G3+)
ACID-BASE DEFICIT: 13 mmol/L — AB (ref 0.0–2.0)
Acid-base deficit: 13 mmol/L — ABNORMAL HIGH (ref 0.0–2.0)
BICARBONATE: 15.5 meq/L — AB (ref 20.0–24.0)
Bicarbonate: 15.2 mEq/L — ABNORMAL LOW (ref 20.0–24.0)
O2 SAT: 66 %
O2 Saturation: 67 %
PCO2 ART: 41.9 mmHg (ref 35.0–45.0)
Patient temperature: 97.5
TCO2: 17 mmol/L (ref 0–100)
TCO2: 16 mmol/L (ref 0–100)
pCO2 arterial: 34.6 mmHg — ABNORMAL LOW (ref 35.0–45.0)
pH, Arterial: 7.229 — ABNORMAL LOW (ref 7.350–7.450)
pH, Arterial: 7.164 — CL (ref 7.350–7.450)
pO2, Arterial: 31 mmHg — CL (ref 80.0–100.0)
pO2, Arterial: 42 mmHg — ABNORMAL LOW (ref 80.0–100.0)

## 2014-11-29 LAB — BLOOD GAS, ARTERIAL
ACID-BASE DEFICIT: 10.4 mmol/L — AB (ref 0.0–2.0)
Acid-base deficit: 7.8 mmol/L — ABNORMAL HIGH (ref 0.0–2.0)
Bicarbonate: 15.8 mEq/L — ABNORMAL LOW (ref 20.0–24.0)
Bicarbonate: 18.2 mEq/L — ABNORMAL LOW (ref 20.0–24.0)
Drawn by: 347621
Drawn by: 36527
FIO2: 1 %
FIO2: 1 %
LHR: 30 {breaths}/min
MECHVT: 300 mL
O2 SAT: 74.4 %
O2 SAT: 74.7 %
PATIENT TEMPERATURE: 91
PEEP/CPAP: 18 cmH2O
PEEP: 15 cmH2O
PO2 ART: 33.5 mmHg — AB (ref 80.0–100.0)
Patient temperature: 91.1
RATE: 30 resp/min
TCO2: 17 mmol/L (ref 0–100)
TCO2: 19.6 mmol/L (ref 0–100)
VT: 300 mL
pCO2 arterial: 32.1 mmHg — ABNORMAL LOW (ref 35.0–45.0)
pCO2 arterial: 36.2 mmHg (ref 35.0–45.0)
pH, Arterial: 7.283 — ABNORMAL LOW (ref 7.350–7.450)
pH, Arterial: 7.295 — ABNORMAL LOW (ref 7.350–7.450)

## 2014-11-29 LAB — I-STAT ARTERIAL BLOOD GAS, ED
ACID-BASE DEFICIT: 14 mmol/L — AB (ref 0.0–2.0)
Bicarbonate: 19.2 mEq/L — ABNORMAL LOW (ref 20.0–24.0)
O2 Saturation: 90 %
PH ART: 7.052 — AB (ref 7.350–7.450)
PO2 ART: 64 mmHg — AB (ref 80.0–100.0)
Patient temperature: 88.88
TCO2: 22 mmol/L (ref 0–100)
pCO2 arterial: 63.3 mmHg (ref 35.0–45.0)

## 2014-11-29 LAB — BASIC METABOLIC PANEL
ANION GAP: 13 (ref 5–15)
Anion gap: 14 (ref 5–15)
BUN: 11 mg/dL (ref 6–23)
BUN: 14 mg/dL (ref 6–23)
CALCIUM: 9 mg/dL (ref 8.4–10.5)
CHLORIDE: 89 meq/L — AB (ref 96–112)
CO2: 18 meq/L — AB (ref 19–32)
CO2: 24 meq/L (ref 19–32)
CREATININE: 0.31 mg/dL — AB (ref 0.50–1.10)
CREATININE: 0.32 mg/dL — AB (ref 0.50–1.10)
Calcium: 7.1 mg/dL — ABNORMAL LOW (ref 8.4–10.5)
Chloride: 96 mEq/L (ref 96–112)
GFR calc Af Amer: >90 mL/min (ref >90)
GFR calc non Af Amer: >90 mL/min (ref >90)
GFR calc non Af Amer: >90 mL/min (ref >90)
Glucose, Bld: 246 mg/dL — ABNORMAL HIGH (ref 70–99)
Glucose, Bld: 332 mg/dL — ABNORMAL HIGH (ref 70–99)
Potassium: 4.1 mEq/L (ref 3.7–5.3)
Potassium: 4.5 mEq/L (ref 3.7–5.3)
SODIUM: 127 meq/L — AB (ref 137–147)
SODIUM: 127 meq/L — AB (ref 137–147)

## 2014-11-29 LAB — PHOSPHORUS
Phosphorus: 3.4 mg/dL (ref 2.3–4.6)
Phosphorus: 2.7 mg/dL (ref 2.3–4.6)

## 2014-11-29 LAB — CBC WITH DIFFERENTIAL/PLATELET
BASOS ABS: 0 10*3/uL (ref 0.0–0.1)
Basophils Relative: 0 % (ref 0–1)
EOS ABS: 0 10*3/uL (ref 0.0–0.7)
Eosinophils Relative: 1 % (ref 0–5)
HCT: 42.2 % (ref 36.0–46.0)
Hemoglobin: 14.9 g/dL (ref 12.0–15.0)
LYMPHS PCT: 21 % (ref 12–46)
Lymphs Abs: 0.4 10*3/uL — ABNORMAL LOW (ref 0.7–4.0)
MCH: 33.6 pg (ref 26.0–34.0)
MCHC: 35.3 g/dL (ref 30.0–36.0)
MCV: 95.3 fL (ref 78.0–100.0)
MONO ABS: 0.1 10*3/uL (ref 0.1–1.0)
Monocytes Relative: 5 % (ref 3–12)
NEUTROS PCT: 73 % (ref 43–77)
Neutro Abs: 1.4 10*3/uL — ABNORMAL LOW (ref 1.7–7.7)
PLATELETS: 26 10*3/uL — AB (ref 150–400)
RBC: 4.43 MIL/uL (ref 3.87–5.11)
RDW: 13.7 % (ref 11.5–15.5)
WBC: 1.9 10*3/uL — ABNORMAL LOW (ref 4.0–10.5)

## 2014-11-29 LAB — DIC (DISSEMINATED INTRAVASCULAR COAGULATION)PANEL
D-Dimer, Quant: 0.64 ug/mL-FEU — ABNORMAL HIGH (ref 0.00–0.48)
Platelets: 24 10*3/uL — CL (ref 150–400)
Smear Review: NONE SEEN
aPTT: 52 seconds — ABNORMAL HIGH (ref 24–37)

## 2014-11-29 LAB — PROTIME-INR
INR: 1.4 (ref 0.00–1.49)
PROTHROMBIN TIME: 17.3 s — AB (ref 11.6–15.2)

## 2014-11-29 LAB — DIC (DISSEMINATED INTRAVASCULAR COAGULATION) PANEL
Fibrinogen: 400 mg/dL (ref 204–475)
INR: 1.33 (ref 0.00–1.49)
Prothrombin Time: 16.6 seconds — ABNORMAL HIGH (ref 11.6–15.2)

## 2014-11-29 LAB — URINALYSIS, ROUTINE W REFLEX MICROSCOPIC
Bilirubin Urine: NEGATIVE
GLUCOSE, UA: 100 mg/dL — AB
Hgb urine dipstick: NEGATIVE
Ketones, ur: NEGATIVE mg/dL
LEUKOCYTES UA: NEGATIVE
Nitrite: NEGATIVE
PH: 6 (ref 5.0–8.0)
Protein, ur: NEGATIVE mg/dL
SPECIFIC GRAVITY, URINE: 1.019 (ref 1.005–1.030)
Urobilinogen, UA: 1 mg/dL (ref 0.0–1.0)

## 2014-11-29 LAB — PRO B NATRIURETIC PEPTIDE
PRO B NATRI PEPTIDE: 427.1 pg/mL — AB (ref 0–125)
PRO B NATRI PEPTIDE: 444.6 pg/mL — AB (ref 0–125)

## 2014-11-29 LAB — COMPREHENSIVE METABOLIC PANEL
ALK PHOS: 94 U/L (ref 39–117)
ALT: 37 U/L — AB (ref 0–35)
AST: 20 U/L (ref 0–37)
Albumin: 1 g/dL — ABNORMAL LOW (ref 3.5–5.2)
Anion gap: 12 (ref 5–15)
BUN: 11 mg/dL (ref 6–23)
CO2: 18 meq/L — AB (ref 19–32)
Calcium: 6 mg/dL — CL (ref 8.4–10.5)
Chloride: 99 mEq/L (ref 96–112)
Creatinine, Ser: 0.3 mg/dL — ABNORMAL LOW (ref 0.50–1.10)
GLUCOSE: 289 mg/dL — AB (ref 70–99)
POTASSIUM: 3.8 meq/L (ref 3.7–5.3)
SODIUM: 129 meq/L — AB (ref 137–147)
Total Bilirubin: <0.2 mg/dL — ABNORMAL LOW (ref 0.3–1.2)
Total Protein: 2.9 g/dL — ABNORMAL LOW (ref 6.0–8.3)

## 2014-11-29 LAB — CARBOXYHEMOGLOBIN
CARBOXYHEMOGLOBIN: 0.4 % — AB (ref 0.5–1.5)
Carboxyhemoglobin: 0.6 % (ref 0.5–1.5)
METHEMOGLOBIN: 0.8 % (ref 0.0–1.5)
Methemoglobin: 0.8 % (ref 0.0–1.5)
O2 Saturation: 59.3 %
O2 Saturation: 28.6 %
Total hemoglobin: 10.7 g/dL — ABNORMAL LOW (ref 12.0–16.0)
Total hemoglobin: 9.7 g/dL — ABNORMAL LOW (ref 12.0–16.0)

## 2014-11-29 LAB — TYPE AND SCREEN
ABO/RH(D): O POS
Antibody Screen: NEGATIVE

## 2014-11-29 LAB — STREP PNEUMONIAE URINARY ANTIGEN: Strep Pneumo Urinary Antigen: NEGATIVE

## 2014-11-29 LAB — AMYLASE: Amylase: 548 U/L — ABNORMAL HIGH (ref 0–105)

## 2014-11-29 LAB — INFLUENZA PANEL BY PCR (TYPE A & B)
H1N1 flu by pcr: NOT DETECTED
INFLAPCR: NEGATIVE
Influenza B By PCR: NEGATIVE

## 2014-11-29 LAB — PROCALCITONIN: Procalcitonin: 30.99 ng/mL

## 2014-11-29 LAB — LIPASE, BLOOD: Lipase: 33 U/L (ref 11–59)

## 2014-11-29 LAB — GLUCOSE, CAPILLARY
GLUCOSE-CAPILLARY: 318 mg/dL — AB (ref 70–99)
Glucose-Capillary: 297 mg/dL — ABNORMAL HIGH (ref 70–99)
Glucose-Capillary: 278 mg/dL — ABNORMAL HIGH (ref 70–99)

## 2014-11-29 LAB — LACTIC ACID, PLASMA
LACTIC ACID, VENOUS: 3.8 mmol/L — AB (ref 0.5–2.2)
Lactic Acid, Venous: 6.7 mmol/L — ABNORMAL HIGH (ref 0.5–2.2)

## 2014-11-29 LAB — TROPONIN I
Troponin I: <0.3 ng/mL (ref <0.30)
Troponin I: <0.3 ng/mL (ref <0.30)

## 2014-11-29 LAB — FIBRINOGEN: FIBRINOGEN: 392 mg/dL (ref 204–475)

## 2014-11-29 LAB — CORTISOL: CORTISOL PLASMA: 20.9 ug/dL

## 2014-11-29 LAB — APTT: aPTT: 57 seconds — ABNORMAL HIGH (ref 24–37)

## 2014-11-29 LAB — MRSA PCR SCREENING: MRSA BY PCR: NEGATIVE

## 2014-11-29 LAB — ABO/RH: ABO/RH(D): O POS

## 2014-11-29 LAB — I-STAT CG4 LACTIC ACID, ED: LACTIC ACID, VENOUS: 4.14 mmol/L — AB (ref 0.5–2.2)

## 2014-11-29 MED ORDER — NOREPINEPHRINE BITARTRATE 1 MG/ML IV SOLN
5.0000 ug/min | INTRAVENOUS | Status: DC
  Administered 2014-11-29: 50 ug/min via INTRAVENOUS
  Filled 2014-11-29 (×3): qty 16

## 2014-11-29 MED ORDER — DEXTROSE 5 % IV SOLN
500.0000 mg | Freq: Once | INTRAVENOUS | Status: AC
  Administered 2014-11-29: 500 mg via INTRAVENOUS

## 2014-11-29 MED ORDER — FENTANYL CITRATE 0.05 MG/ML IJ SOLN
50.0000 ug | Freq: Once | INTRAMUSCULAR | Status: AC
  Administered 2014-11-29: 50 ug via INTRAVENOUS

## 2014-11-29 MED ORDER — PHENYTOIN 50 MG PO CHEW
50.0000 mg | CHEWABLE_TABLET | Freq: Three times a day (TID) | ORAL | Status: DC
  Filled 2014-11-29 (×3): qty 1

## 2014-11-29 MED ORDER — INFLUENZA VAC SPLIT QUAD 0.5 ML IM SUSY: 0.5000 mL | PREFILLED_SYRINGE | INTRAMUSCULAR | Status: DC

## 2014-11-29 MED ORDER — LEVETIRACETAM IN NACL 1500 MG/100ML IV SOLN
1500.0000 mg | INTRAVENOUS | Status: DC
  Filled 2014-11-29: qty 100

## 2014-11-29 MED ORDER — TOBRAMYCIN SULFATE 80 MG/2ML IJ SOLN
7.0000 mg/kg | INTRAVENOUS | Status: DC
  Administered 2014-11-29: 280 mg via INTRAVENOUS
  Filled 2014-11-29: qty 7

## 2014-11-29 MED ORDER — PHENYLEPHRINE HCL 10 MG/ML IJ SOLN
0.0000 ug/min | INTRAVENOUS | Status: DC
  Filled 2014-11-29: qty 1

## 2014-11-29 MED ORDER — CISATRACURIUM BOLUS VIA INFUSION
2.5000 mg | Freq: Once | INTRAVENOUS | Status: AC
  Administered 2014-11-29: 2.5 mg via INTRAVENOUS
  Filled 2014-11-29: qty 3

## 2014-11-29 MED ORDER — CISATRACURIUM BESYLATE 10 MG/ML IV SOLN
3.0000 ug/kg/min | INTRAVENOUS | Status: DC
  Administered 2014-11-29: 3 ug/kg/min via INTRAVENOUS
  Filled 2014-11-29: qty 20

## 2014-11-29 MED ORDER — DOCUSATE SODIUM 100 MG PO CAPS
100.0000 mg | ORAL_CAPSULE | Freq: Two times a day (BID) | ORAL | Status: DC | PRN
  Filled 2014-11-29: qty 1

## 2014-11-29 MED ORDER — SODIUM CHLORIDE 0.9 % IV SOLN
INTRAVENOUS | Status: DC
  Administered 2014-11-29 (×2): via INTRAVENOUS

## 2014-11-29 MED ORDER — MIDAZOLAM BOLUS VIA INFUSION
1.0000 mg | INTRAVENOUS | Status: DC | PRN
  Filled 2014-11-29: qty 2

## 2014-11-29 MED ORDER — SODIUM CHLORIDE 0.9 % IV BOLUS (SEPSIS)
1000.0000 mL | Freq: Once | INTRAVENOUS | Status: AC
  Administered 2014-11-29: 1000 mL via INTRAVENOUS

## 2014-11-29 MED ORDER — MAGNESIUM SULFATE 4 GM/100ML IV SOLN
4.0000 g | Freq: Once | INTRAVENOUS | Status: AC
  Administered 2014-11-29: 4 g via INTRAVENOUS
  Filled 2014-11-29: qty 100

## 2014-11-29 MED ORDER — CALCIUM GLUCONATE 10 % IV SOLN
1.0000 g | Freq: Once | INTRAVENOUS | Status: AC
  Administered 2014-11-29: 1 g via INTRAVENOUS
  Filled 2014-11-29: qty 10

## 2014-11-29 MED ORDER — CHLORHEXIDINE GLUCONATE 0.12 % MT SOLN: 15.0000 mL | Freq: Two times a day (BID) | OROMUCOSAL | Status: DC

## 2014-11-29 MED ORDER — HEPARIN SODIUM (PORCINE) 5000 UNIT/ML IJ SOLN
5000.0000 [IU] | Freq: Two times a day (BID) | INTRAMUSCULAR | Status: DC
  Filled 2014-11-29 (×2): qty 1

## 2014-11-29 MED ORDER — OSELTAMIVIR PHOSPHATE 6 MG/ML PO SUSR
60.0000 mg | Freq: Two times a day (BID) | ORAL | Status: DC
  Administered 2014-11-29: 60 mg via ORAL
  Filled 2014-11-29 (×2): qty 10

## 2014-11-29 MED ORDER — FENTANYL BOLUS VIA INFUSION
50.0000 ug | INTRAVENOUS | Status: DC | PRN
  Filled 2014-11-29: qty 50

## 2014-11-29 MED ORDER — PHENYTOIN SODIUM 50 MG/ML IJ SOLN
50.0000 mg | Freq: Three times a day (TID) | INTRAMUSCULAR | Status: DC
  Administered 2014-11-29: 50 mg via INTRAVENOUS
  Filled 2014-11-29 (×4): qty 1

## 2014-11-29 MED ORDER — CETYLPYRIDINIUM CHLORIDE 0.05 % MT LIQD: 7.0000 mL | Freq: Four times a day (QID) | OROMUCOSAL | Status: DC

## 2014-11-29 MED ORDER — VANCOMYCIN HCL 500 MG IV SOLR
500.0000 mg | Freq: Two times a day (BID) | INTRAVENOUS | Status: DC
  Administered 2014-11-29: 500 mg via INTRAVENOUS
  Filled 2014-11-29 (×3): qty 500

## 2014-11-29 MED ORDER — ATROPINE SULFATE 0.1 MG/ML IJ SOLN: 1.0000 mg | Freq: Once | INTRAMUSCULAR | Status: AC

## 2014-11-29 MED ORDER — SODIUM CHLORIDE 0.9 % IV SOLN
250.0000 mL | INTRAVENOUS | Status: DC | PRN
Start: 2014-11-29 — End: 2014-11-29

## 2014-11-29 MED ORDER — SODIUM CHLORIDE 0.9 % IV SOLN
0.0000 mg/h | INTRAVENOUS | Status: DC
  Administered 2014-11-29: 2 mg/h via INTRAVENOUS
  Filled 2014-11-29: qty 10

## 2014-11-29 MED ORDER — DEXTROSE 5 % IV SOLN: 500.0000 mg | INTRAVENOUS | Status: DC

## 2014-11-29 MED ORDER — DEXTROSE 5 % IV SOLN: 1.0000 g | INTRAVENOUS | Status: DC

## 2014-11-29 MED ORDER — LACOSAMIDE 50 MG PO TABS
100.0000 mg | ORAL_TABLET | Freq: Four times a day (QID) | ORAL | Status: DC
  Administered 2014-11-29: 100 mg
  Filled 2014-11-29: qty 2

## 2014-11-29 MED ORDER — PANTOPRAZOLE SODIUM 40 MG IV SOLR
40.0000 mg | Freq: Every day | INTRAVENOUS | Status: DC
  Administered 2014-11-29: 40 mg via INTRAVENOUS
  Filled 2014-11-29: qty 40

## 2014-11-29 MED ORDER — DOBUTAMINE IN D5W 4-5 MG/ML-% IV SOLN
2.5000 ug/kg/min | INTRAVENOUS | Status: DC
  Filled 2014-11-29: qty 250

## 2014-11-29 MED ORDER — CHLORHEXIDINE GLUCONATE 0.12 % MT SOLN
15.0000 mL | Freq: Two times a day (BID) | OROMUCOSAL | Status: DC
  Administered 2014-11-29: 15 mL via OROMUCOSAL

## 2014-11-29 MED ORDER — ATROPINE SULFATE 0.1 MG/ML IJ SOLN
1.0000 mg | Freq: Once | INTRAMUSCULAR | Status: AC
  Administered 2014-11-29: 1 mg via INTRAVENOUS

## 2014-11-29 MED ORDER — LEVETIRACETAM IN NACL 1000 MG/100ML IV SOLN
1000.0000 mg | INTRAVENOUS | Status: DC
  Filled 2014-11-29: qty 100

## 2014-11-29 MED ORDER — SODIUM BICARBONATE 8.4 % IV SOLN
50.0000 meq | Freq: Once | INTRAVENOUS | Status: AC
  Administered 2014-11-29: 50 meq via INTRAVENOUS

## 2014-11-29 MED ORDER — SODIUM CHLORIDE 0.9 % IV SOLN: INTRAVENOUS | Status: DC

## 2014-11-29 MED ORDER — FENTANYL CITRATE 0.05 MG/ML IJ SOLN
100.0000 ug | Freq: Once | INTRAMUSCULAR | Status: DC
Start: 2014-11-29 — End: 2014-11-29

## 2014-11-29 MED ORDER — SODIUM CHLORIDE 0.9 % IV SOLN
25.0000 ug/h | INTRAVENOUS | Status: DC
  Administered 2014-11-29: 50 ug/h via INTRAVENOUS
  Filled 2014-11-29: qty 50

## 2014-11-29 MED ORDER — DEXTROSE 5 % IV SOLN
30.0000 ug/min | INTRAVENOUS | Status: DC
  Filled 2014-11-29: qty 1

## 2014-11-29 MED ORDER — DEXTROSE 5 % IV SOLN
5.0000 ug/min | INTRAVENOUS | Status: DC
  Administered 2014-11-29: 25 ug/min via INTRAVENOUS
  Filled 2014-11-29 (×2): qty 4

## 2014-11-29 MED ORDER — DEXTROSE 5 % IV SOLN
1.0000 g | Freq: Once | INTRAVENOUS | Status: AC
  Administered 2014-11-29: 1 g via INTRAVENOUS

## 2014-11-29 MED ORDER — SODIUM CHLORIDE 0.9 % IV SOLN: Freq: Once | INTRAVENOUS | Status: AC

## 2014-11-29 MED ORDER — NOREPINEPHRINE BITARTRATE 1 MG/ML IV SOLN: 2.0000 ug/min | INTRAVENOUS | Status: DC

## 2014-11-29 MED ORDER — INSULIN ASPART 100 UNIT/ML ~~LOC~~ SOLN
0.0000 [IU] | SUBCUTANEOUS | Status: DC
  Administered 2014-11-29 (×2): 5 [IU] via SUBCUTANEOUS

## 2014-11-29 MED ORDER — SODIUM CHLORIDE 0.9 % IV SOLN
Freq: Once | INTRAVENOUS | Status: AC
  Administered 2014-11-29: via INTRAVENOUS

## 2014-11-29 MED ORDER — PROPOFOL 10 MG/ML IV EMUL
5.0000 ug/kg/min | INTRAVENOUS | Status: DC
  Administered 2014-11-29: 10 ug/kg/min via INTRAVENOUS

## 2014-11-29 MED ORDER — PNEUMOCOCCAL VAC POLYVALENT 25 MCG/0.5ML IJ INJ: 0.5000 mL | INJECTION | INTRAMUSCULAR | Status: DC

## 2014-11-29 MED ORDER — SODIUM CHLORIDE 0.9 % IV SOLN
Freq: Once | INTRAVENOUS | Status: AC
  Administered 2014-11-29: 01:00:00 via INTRAVENOUS

## 2014-11-29 MED ORDER — FELBAMATE 600 MG/5ML PO SUSP
600.0000 mg | Freq: Two times a day (BID) | ORAL | Status: DC
  Administered 2014-11-29: 600 mg
  Filled 2014-11-29 (×2): qty 5

## 2014-11-29 MED ORDER — FENTANYL CITRATE 0.05 MG/ML IJ SOLN: 100.0000 ug | Freq: Once | INTRAMUSCULAR | Status: DC | PRN

## 2014-11-29 MED ORDER — LEVETIRACETAM IN NACL 1500 MG/100ML IV SOLN
1500.0000 mg | Freq: Two times a day (BID) | INTRAVENOUS | Status: DC
  Administered 2014-11-29: 1500 mg via INTRAVENOUS
  Filled 2014-11-29 (×2): qty 100

## 2014-11-29 MED ORDER — CETYLPYRIDINIUM CHLORIDE 0.05 % MT LIQD
7.0000 mL | Freq: Four times a day (QID) | OROMUCOSAL | Status: DC
  Administered 2014-11-29 (×2): 7 mL via OROMUCOSAL

## 2014-11-29 MED ORDER — ARTIFICIAL TEARS OP OINT
1.0000 "application " | TOPICAL_OINTMENT | Freq: Three times a day (TID) | OPHTHALMIC | Status: DC
  Administered 2014-11-29: 1 via OPHTHALMIC
  Filled 2014-11-29: qty 3.5

## 2014-11-29 MED ORDER — PIPERACILLIN-TAZOBACTAM 3.375 G IVPB
3.3750 g | Freq: Three times a day (TID) | INTRAVENOUS | Status: DC
  Administered 2014-11-29: 3.375 g via INTRAVENOUS
  Filled 2014-11-29 (×4): qty 50

## 2014-11-29 MED ORDER — VASOPRESSIN 20 UNIT/ML IV SOLN
0.0300 [IU]/min | INTRAVENOUS | Status: DC
  Administered 2014-11-29: 0.03 [IU]/min via INTRAVENOUS
  Filled 2014-11-29: qty 2

## 2014-11-29 MED ORDER — FENTANYL CITRATE 0.05 MG/ML IJ SOLN
INTRAMUSCULAR | Status: DC | PRN
  Administered 2014-11-29: 50 ug via INTRAVENOUS

## 2014-11-30 LAB — PREPARE PLATELET PHERESIS: Unit division: 0

## 2014-12-01 LAB — LEGIONELLA ANTIGEN, URINE

## 2014-12-01 LAB — RESPIRATORY VIRUS PANEL
ADENOVIRUS: NOT DETECTED
INFLUENZA A H3: NOT DETECTED
INFLUENZA A: NOT DETECTED
Influenza A H1: NOT DETECTED
Influenza B: NOT DETECTED
Metapneumovirus: NOT DETECTED
PARAINFLUENZA 1 A: NOT DETECTED
Parainfluenza 2: NOT DETECTED
Parainfluenza 3: NOT DETECTED
RESPIRATORY SYNCYTIAL VIRUS B: NOT DETECTED
Respiratory Syncytial Virus A: NOT DETECTED
Rhinovirus: NOT DETECTED

## 2014-12-01 LAB — URINE CULTURE

## 2014-12-01 NOTE — Discharge Summary (Signed)
PULMONARY / CRITICAL CARE MEDICINE   Name: Pennie RushingJordyn Laban MRN: [id number] DOB: [birth year]    ADMISSION DATE:  12/17/2014 CONSULTATION DATE:  12/06/2014  REFERRING MD :  EDP  CHIEF COMPLAINT:  Acute hypoxic respiratory failure, septic shock  INITIAL PRESENTATION: To ED from home with respiratory distress  STUDIES:  CXR - 12/18/2014 - multifocal pneumonia  SIGNIFICANT EVENTS: Intubated in ED on 12/11/2014 CVC placed in ED 12/04/2014 Femoral A-line placed 12/18/2014   HISTORY OF PRESENT ILLNESS:  23 y/o w/CP/MR, sz d/o and asthma presented with 1 week of cough and worsening shortness of breath.  On presentation to ED was hypoxic and hypothermic.  Was intubated in the ED.  Developed ARDS & septic shock  ASSESSMENT / PLAN:  PULMONARY OETT: intubated in ED A: ARDS P:  Lung protective ventilation Goal pO2 >55 Permissive hypercapnea - target pH of 7.25 -nimbex gtt    CARDIOVASCULAR CVL:IJ CVC A: Septic shock P:  Pressors to maintain MAP>65 Stress dose steroids Brady episode >> given atropine x 1   RENAL A:  hypocalcemic P:   Replete electrolytes as needed Monitor urine output.   GASTROINTESTINAL A:  Abdomen distended and tympanic P:   KUB neg for free air  HEMATOLOGIC A:  Leukopenic, thrombocytopenic, coagulopathy P:  Like secondary to sepsis and possibly developing DIC Transfuse for platelets less than 10, Hgb less than 7, consider FFP for low fibrinogen  INFECTIOUS A:  Multifocal pna P:   BCx2 12/13/2014 >>neg UC 12/08/2014>>grp B strep Sputum 12/19/2014>> pending Abx: Ceftriaxone x1, azithromycinx1 Vancomycin, Zosyn,  ENDOCRINE A:  Hyperglycemia   P:   Glucose stabilizer Cortisol pending given fluid refractory shock  NEUROLOGIC A:  CP P:  Sedated RASS goal: -3, -4    FAMILY  - Updates: Mother at bedside    COURSE : 23 y/o with CP and sz d/o admitted with septic shock, ARDS & refractory hypoxia. Unfortunately prognosis is grave, d.w mother  & made DNR Tried nimbex but this did not improve her oxygenation, , not a candidate for proning, high risk transport for ECMO. She eventually had a bradycardic episode & passed away.  Cause of death - ARDS, pneumonia   Cyril Mourningakesh Dezyre Hoefer MD. FCCP. [facility] Pager [phone] If no response call [phone]      12/01/2014, 5:12 PM

## 2014-12-05 LAB — CULTURE, BLOOD (ROUTINE X 2)
Culture: NO GROWTH
Culture: NO GROWTH

## 2014-12-26 NOTE — Progress Notes (Addendum)
ANTIBIOTIC CONSULT NOTE - INITIAL  Pharmacy Consult for vancomycin, zosyn, tobramycin  Indication: sepsis  No Known Allergies  Patient Measurements: Height: 3' 10.06" (117 cm) Weight: 87 lb (39.463 kg) IBW/kg (Calculated) : 13.44 Adjusted Body Weight:   Vital Signs: Temp: 90 F (32.2 C) (12/05 0010) Temp Source: Rectal (12/05 0010) BP: 95/47 mmHg (12/05 0248) Pulse Rate: 62 (12/05 0248) Intake/Output from previous day: 12/04 0701 - 12/05 0700 In: 2000 [I.V.:2000] Out: -  Intake/Output from this shift: Total I/O In: 2000 [I.V.:2000] Out: -   Labs:  Recent Labs  12-12-14 0004  WBC 1.9*  HGB 14.9  PLT 26*  CREATININE 0.32*   Estimated Creatinine Clearance: 41.1 mL/min (by C-G formula based on Cr of 0.32). No results for input(s): VANCOTROUGH, VANCOPEAK, VANCORANDOM, GENTTROUGH, GENTPEAK, GENTRANDOM, TOBRATROUGH, TOBRAPEAK, TOBRARND, AMIKACINPEAK, AMIKACINTROU, AMIKACIN in the last 72 hours.   Microbiology: No results found for this or any previous visit (from the past 720 hour(s)).  Medical History: Past Medical History  Diagnosis Date  . Seizures   . Cerebral palsy   . Asthma   . Pneumonia     Medications:  Prescriptions prior to admission  Medication Sig Dispense Refill Last Dose  . clonazePAM (KLONOPIN) 0.5 MG tablet Take 0.5 mg by mouth 3 (three) times daily.   12/04/2014 at Unknown time  . clonazePAM (KLONOPIN) 0.5 MG tablet Take 0.5 mg by mouth 2 (two) times daily as needed for anxiety.     . felbamate (FELBATOL) 600 MG/5ML suspension Take 600 mg by mouth 2 (two) times daily.   12/16/2014 at Unknown time  . lacosamide (VIMPAT) 200 MG TABS tablet 100 mg by Gastric Tube route every 6 (six) hours.   12/07/2014 at Unknown time  . levETIRAcetam (KEPPRA) 1000 MG tablet 1,000-1,500 mg by Gastric Tube route 2 (two) times daily. Take 1 and 1/2 tablets every morning and take 1 tablet every evening   12/15/2014 at Unknown time  . phenytoin (DILANTIN) 50 MG tablet Chew  50 mg by mouth 3 (three) times daily.   12/20/2014 at Unknown time  . pregabalin (LYRICA) 75 MG capsule Take 75 mg by mouth daily.   12/21/2014 at Unknown time  . clonazePAM (KLONOPIN) 0.5 MG tablet TAKE 1 TABLET BY MOUTH THREE TIMES DAILY 93 tablet 5 Taking  . clonazePAM (KLONOPIN) 0.5 MG tablet Take 0.5 mg by mouth 2 (two) times daily as needed for anxiety.     . felbamate (FELBATOL) 600 MG/5ML suspension TAKE 5 ML BY MOUTH TWICE DAILY 310 mL 5   . ibuprofen (ADVIL,MOTRIN) 100 MG/5ML suspension Give 10 ml per G-tube every 4-6 hours PRN fever   Not Taking at Unknown time  . levETIRAcetam (KEPPRA) 1000 MG tablet TAKE 1 AND 1/2 TABLETS BY MOUTH EVERY MORNING AND 1 TABLET AT NIGHT. CRUSH TABLETS AND PUT IN GTUBE 80 tablet 5   . LYRICA 75 MG capsule TAKE ONE CAPSULE BY MOUTH DAILY 30 capsule 5 Taking  . PHENYTOIN INFATABS 50 MG tablet CHEW AND SWALLOW 1 TABLET BY MOUTH EVERY 8 HOURS 93 tablet 5 Taking  . VIMPAT 200 MG TABS tablet TAKE 1/2 TABLET BY MOUTH EVERY 6 HOURS VIA GASTRIC TUBE 60 tablet 5    Assessment: 24 yo cerebral palsy patient with hx of cp and seizures with previous PNA. Increased wheezing and sob. Now broadening abx coverage to cover sepsis/pna and double cover psuedomonas ? With vanc/zosyn and tobra.  Pt weight is 10 kg more than last documented weight. crcl calculation  is inaccurate due to inactivity as she is wheelchair bound. Pt has foley with ~ 1 ml/kg/hr  of measured output. Will monitor urine output with increased hydration and compare with calculated crcl to get a better picture of true elimination curve.   Goal of Therapy:  vanc 15-68mcg/ml Tobramycin extended interval dosing    Plan:  Zosyn 3.375 gm q8h  Vancomycin 500 mg q12h  Tobramycin 280mg  x1 check 10 hour tobra level to calculate dosing interval.  F/u trnding bmet/UOP for dose interval changes.   Janice Coffin 12/12/2014,3:20 AM

## 2014-12-26 NOTE — ED Provider Notes (Addendum)
CSN: [id number]     Arrival date & time 12/23/2014  2330 History   First MD Initiated Contact with Patient 12/12/2014 0004     Chief Complaint  Patient presents with  . Wheezing  . Shortness of Breath     (Consider location/radiation/quality/duration/timing/severity/associated sxs/prior Treatment) HPI 24 yo female presents to the ER from home with complaint of sob.  History is given by mother.  Patient has history of CP, seizures and prior pneumonia.  She is wheelchair-bound.  Patient unable to give history, is noted to have significant respiratory distress.  Mother reports patient had wheezing and shortness of breath this morning.  Over the last 2 hours.  She has been struggling to breathe.  No known fever.  Patient had cough a week ago, seen by PCP per per report and was nothing to worry about. Past Medical History  Diagnosis Date  . Seizures   . Cerebral palsy   . Asthma   . Pneumonia    Past Surgical History  Procedure Laterality Date  . Back surgery      spinal fusion  . Hip surgery      bilateral  . Peg placement  July 2010   Family History  Problem Relation Age of Onset  . Ovarian cancer Maternal Grandmother     Died at 3853  . Kidney cancer Maternal Grandfather     Died at 1859   History  Substance Use Topics  . Smoking status: Never Smoker   . Smokeless tobacco: Never Used  . Alcohol Use: No   OB History    No data available     Review of Systems  Unable to perform ROS: Patient nonverbal      Allergies  Review of patient's allergies indicates no known allergies.  Home Medications   Prior to Admission medications   Medication Sig Start Date End Date Taking? Authorizing Provider  clonazePAM (KLONOPIN) 0.5 MG tablet TAKE 1 TABLET BY MOUTH THREE TIMES DAILY    Elveria Risingina Goodpasture, NP  felbamate (FELBATOL) 600 MG/5ML suspension TAKE 5 ML BY MOUTH TWICE DAILY 08/08/14   Elveria Risingina Goodpasture, NP  ibuprofen (ADVIL,MOTRIN) 100 MG/5ML suspension Give 10 ml per G-tube every  4-6 hours PRN fever    Historical Provider, MD  levETIRAcetam (KEPPRA) 1000 MG tablet TAKE 1 AND 1/2 TABLETS BY MOUTH EVERY MORNING AND 1 TABLET AT NIGHT. CRUSH TABLETS AND PUT IN GTUBE 08/08/14   Elveria Risingina Goodpasture, NP  LYRICA 75 MG capsule TAKE ONE CAPSULE BY MOUTH DAILY    Elveria Risingina Goodpasture, NP  PHENYTOIN INFATABS 50 MG tablet CHEW AND SWALLOW 1 TABLET BY MOUTH EVERY 8 HOURS    Tina Goodpasture, NP  VIMPAT 200 MG TABS tablet TAKE 1/2 TABLET BY MOUTH EVERY 6 HOURS VIA GASTRIC TUBE 08/08/14   Elveria Risingina Goodpasture, NP   BP 90/49 mmHg  Pulse 54  Temp(Src) 90 F (32.2 C) (Rectal)  Resp 14  SpO2 99%  LMP 11/26/2014 Physical Exam  Constitutional: She appears well-developed and well-nourished. She appears distressed.  HENT:  Head: Normocephalic and atraumatic.  Nose: Nose normal.  Mouth/Throat: Oropharynx is clear and moist.  Eyes: Conjunctivae and EOM are normal. Pupils are equal, round, and reactive to light.  Neck: Normal range of motion. Neck supple. No JVD present. No tracheal deviation present. No thyromegaly present.  Cardiovascular: Normal rate, regular rhythm, normal heart sounds and intact distal pulses.  Exam reveals no gallop and no friction rub.   No murmur heard. Pulmonary/Chest: No stridor. She is  in respiratory distress. She has no wheezes. She has no rales. She exhibits no tenderness.  Patient with diffuse rhonchi, patient with agonal breathing  Abdominal: Soft. Bowel sounds are normal. She exhibits no distension and no mass. There is no tenderness. There is no rebound and no guarding.  Musculoskeletal:  Contractures in all 4 extremities  Lymphadenopathy:    She has no cervical adenopathy.  Neurological: She exhibits abnormal muscle tone.  Skin: Skin is dry. No rash noted. No erythema. There is pallor.  She has cool, mottled skin  Nursing note and vitals reviewed.   ED Course  INTUBATION Date/Time: 12/08/14 12:16 AM Performed by: Olivia Mackie Authorized by: Olivia Mackie Consent: The procedure was performed in an emergent situation. Indications: respiratory distress Intubation method: video-assisted Patient status: paralyzed (RSI) Preoxygenation: BVM Sedatives: etomidate Paralytic: succinylcholine Laryngoscope size: Miller 3 Tube size: 6.5 mm Tube type: cuffed Number of attempts: 2 Cricoid pressure: no Cords visualized: yes Post-procedure assessment: chest rise and ETCO2 monitor Breath sounds: equal and absent over the epigastrium Cuff inflated: yes ETT to teeth: 18 cm Tube secured with: ETT holder Chest x-ray interpreted by me. Chest x-ray findings: endotracheal tube in appropriate position Patient tolerance: Patient tolerated the procedure well with no immediate complications  CENTRAL LINE Date/Time: 12/08/14 12:52 AM Performed by: Olivia Mackie Authorized by: Olivia Mackie Consent: The procedure was performed in an emergent situation. Verbal consent obtained. Consent given by: parent Time out: Immediately prior to procedure a "time out" was called to verify the correct patient, procedure, equipment, support staff and site/side marked as required. Indications: vascular access Anesthesia: local infiltration Local anesthetic: lidocaine 1% without epinephrine Anesthetic total: 3 ml Patient sedated: yes Analgesia: fentanyl Vitals: Vital signs were monitored during sedation. Preparation: skin prepped with 2% chlorhexidine Skin prep agent dried: skin prep agent completely dried prior to procedure Sterile barriers: all five maximum sterile barriers used - cap, mask, sterile gown, sterile gloves, and large sterile sheet Hand hygiene: hand hygiene performed prior to central venous catheter insertion Location details: right internal jugular Patient position: Trendelenburg Catheter type: triple lumen Pre-procedure: landmarks identified Ultrasound guidance: yes Sterile ultrasound techniques: sterile gel and sterile probe covers were  used Number of attempts: 2 Successful placement: yes Post-procedure: line sutured Assessment: blood return through all ports,  no pneumothorax on x-ray and placement verified by x-ray Patient tolerance: Patient tolerated the procedure well with no immediate complications   (including critical care time) Labs Review Labs Reviewed  BASIC METABOLIC PANEL - Abnormal; Notable for the following:    Sodium 127 (*)    Chloride 89 (*)    Glucose, Bld 246 (*)    Creatinine, Ser 0.32 (*)    All other components within normal limits  PRO B NATRIURETIC PEPTIDE - Abnormal; Notable for the following:    Pro B Natriuretic peptide (BNP) 444.6 (*)    All other components within normal limits  CBC WITH DIFFERENTIAL - Abnormal; Notable for the following:    WBC 1.9 (*)    Platelets 26 (*)    Neutro Abs 1.4 (*)    Lymphs Abs 0.4 (*)    All other components within normal limits  URINALYSIS, ROUTINE W REFLEX MICROSCOPIC - Abnormal; Notable for the following:    Glucose, UA 100 (*)    All other components within normal limits  COMPREHENSIVE METABOLIC PANEL - Abnormal; Notable for the following:    Sodium 129 (*)    CO2 18 (*)  Glucose, Bld 289 (*)    Creatinine, Ser 0.30 (*)    Calcium 6.0 (*)    Total Protein 2.9 (*)    Albumin 1.0 (*)    ALT 37 (*)    Total Bilirubin <0.2 (*)    All other components within normal limits  MAGNESIUM - Abnormal; Notable for the following:    Magnesium 1.3 (*)    All other components within normal limits  AMYLASE - Abnormal; Notable for the following:    Amylase 548 (*)    All other components within normal limits  LACTIC ACID, PLASMA - Abnormal; Notable for the following:    Lactic Acid, Venous 3.8 (*)    All other components within normal limits  PRO B NATRIURETIC PEPTIDE - Abnormal; Notable for the following:    Pro B Natriuretic peptide (BNP) 427.1 (*)    All other components within normal limits  CBC - Abnormal; Notable for the following:    WBC 1.9  (*)    RBC 3.10 (*)    Hemoglobin 10.1 (*)    HCT 29.5 (*)    Platelets 22 (*)    All other components within normal limits  PROTIME-INR - Abnormal; Notable for the following:    Prothrombin Time 17.3 (*)    All other components within normal limits  APTT - Abnormal; Notable for the following:    aPTT 57 (*)    All other components within normal limits  BLOOD GAS, ARTERIAL - Abnormal; Notable for the following:    pH, Arterial 7.283 (*)    pCO2 arterial 32.1 (*)    Bicarbonate 15.8 (*)    Acid-base deficit 10.4 (*)    All other components within normal limits  DIC (DISSEMINATED INTRAVASCULAR COAGULATION) PANEL - Abnormal; Notable for the following:    Prothrombin Time 16.6 (*)    aPTT 52 (*)    D-Dimer, Quant 0.64 (*)    Platelets 24 (*)    All other components within normal limits  CARBOXYHEMOGLOBIN - Abnormal; Notable for the following:    Total hemoglobin 10.7 (*)    All other components within normal limits  I-STAT CG4 LACTIC ACID, ED - Abnormal; Notable for the following:    Lactic Acid, Venous 4.14 (*)    All other components within normal limits  I-STAT ARTERIAL BLOOD GAS, ED - Abnormal; Notable for the following:    pH, Arterial 7.052 (*)    pCO2 arterial 63.3 (*)    pO2, Arterial 64.0 (*)    Bicarbonate 19.2 (*)    Acid-base deficit 14.0 (*)    All other components within normal limits  POCT I-STAT 3, ART BLOOD GAS (G3+) - Abnormal; Notable for the following:    pH, Arterial 7.229 (*)    pCO2 arterial 34.6 (*)    pO2, Arterial 31.0 (*)    Bicarbonate 15.5 (*)    Acid-base deficit 13.0 (*)    All other components within normal limits  MRSA PCR SCREENING  CULTURE, BLOOD (ROUTINE X 2)  CULTURE, BLOOD (ROUTINE X 2)  URINE CULTURE  CULTURE, RESPIRATORY (NON-EXPECTORATED)  RESPIRATORY VIRUS PANEL  PHOSPHORUS  LIPASE, BLOOD  TROPONIN I  PROCALCITONIN  STREP PNEUMONIAE URINARY ANTIGEN  FIBRINOGEN  TROPONIN I  TROPONIN I  CORTISOL  LEGIONELLA ANTIGEN, URINE   CBC  BASIC METABOLIC PANEL  MAGNESIUM  PHOSPHORUS  TOBRAMYCIN LEVEL, RANDOM  TYPE AND SCREEN  PREPARE PLATELET PHERESIS  ABO/RH    [facility]aging [facility] [address]View  12/11/2014   CLINICAL DATA:  Central line placement.  Initial encounter.  EXAM: PORTABLE CHEST - 1 VIEW  COMPARISON:  Chest radiograph performed earlier today at 12:07 a.m.  FINDINGS: The patient's right IJ line is noted likely ending about the cavoatrial junction.  The patient's endotracheal tube ends perhaps 3 cm above the carina. An enteric tube is noted extending below the diaphragm.  Worsening bilateral airspace opacification raises concern for severe multifocal pneumonia, particularly worsened on the left. A small left pleural effusion is suspected. No pneumothorax is seen.  The cardiomediastinal silhouette is normal in size. No acute osseous abnormalities are identified. Thoracolumbar spinal fusion hardware is noted.  IMPRESSION: 1. Right IJ line noted ending likely about the cavoatrial junction. 2. Endotracheal tube ends perhaps 3 cm above the carina. 3. Worsening bilateral airspace opacification, concerning for worsening severe multifocal pneumonia. Suspect small left pleural effusion.   Electronically Signed   By: Roanna Raider M.D.   On: 2014/12/11 02:32   Dg Chest Portable 1 View  11-Dec-2014   CLINICAL DATA:  Respiratory distress, ETT placed  EXAM: PORTABLE CHEST - 1 VIEW  COMPARISON:  11/04/2012  FINDINGS: Endotracheal tube is obscured by the patient's spinal rods but likely terminates approximately 1.5-2 cm above the carina.  Multifocal patchy opacities in the bilateral upper lobes and left lower lobe, suspicious for pneumonia, possibly on the basis of aspiration. No pleural effusion or pneumothorax.  The heart is top-normal in size.  Enteric tube terminates in the distal gastric body. A gastrostomy tube.  Thoracolumbar scoliosis with Harrington rods.  IMPRESSION: Endotracheal tube is obscured but likely terminates  1.5-2 cm above the carina.   Electronically Signed   By: Charline Bills M.D.   On: 11-Dec-2014 00:36     EKG Interpretation None     CRITICAL CARE Performed by: Olivia Mackie Total critical care time: 90 min Critical care time was exclusive of separately billable procedures and treating other patients. Critical care was necessary to treat or prevent imminent or life-threatening deterioration. Critical care was time spent personally by me on the following activities: development of treatment plan with patient and/or surrogate as well as nursing, discussions with consultants, evaluation of patient's response to treatment, examination of patient, obtaining history from patient or surrogate, ordering and performing treatments and interventions, ordering and review of laboratory studies, ordering and review of radiographic studies, pulse oximetry and re-evaluation of patient's condition.  MDM   Final diagnoses:  SOB (shortness of breath)  Wheezing  Encounter for central line placement  Abdominal distension  ZO:[id number] distress, Community acquired pneumonia, sepsis, hyponatremia, pancytopenia  24 year old female with respiratory distress, kidney acquired pneumonia.  Patient with acute onset of symptoms this morning, reportedly 2 hours of appendectomy breathing prior to evaluation.  Initial oxygen saturations in the 60s.  No improvement with facemask, patient bagged and oxygen saturations improved 100.  Patient very difficult stick, with help with Dr. Hyacinth Meeker, a left EJ was placed.  IV team unable to place a left anterior chest, 22-gauge.  Patient received RSI for intubation.  Upon intubation, pink, frothy material noted in ET tube.  Appropriate positioning based on chest x-ray.  Patient with persistent hypotension despite IV fluids, central line placed in right IJ under ultrasound guidance.  Patient was started on levo fed.  Patient noted be having some oozing from the IV site, platelets at 24.   Discussed with critical care, who will put in for platelets, additional antibiotics, levophed    Olivia Mackie,  MD December 29, 2014 0981  Olivia Mackie, MD 12-29-2014 [phone]

## 2014-12-26 NOTE — ED Notes (Signed)
DR MILLER GIVEN A COPY OF LACTIC ACID RESULTS 4.14

## 2014-12-26 NOTE — Progress Notes (Signed)
PULMONARY / CRITICAL CARE MEDICINE   Name: Allison [name] MRN: [id number] DOB: [birth year]    ADMISSION DATE:  12/02/14 CONSULTATION DATE:  12/15/2014  REFERRING MD :  EDP  CHIEF COMPLAINT:  Acute hypoxic respiratory failure, septic shock  INITIAL PRESENTATION: To ED from home with respiratory distress  STUDIES:  CXR - 12-02-2014 - multifocal pneumonia  SIGNIFICANT EVENTS: Intubated in ED on Dec 02, 2014 CVC placed in ED 02-Dec-2014 Femoral A-line placed 12/22/2014   HISTORY OF PRESENT ILLNESS:  24 y/o w/CP/MR, sz d/o and asthma presented with 1 week of cough and worsening shortness of breath.  On presentation to ED was hypoxic and hypothermic.  Was intubated in the ED.  Developed ARDS & septic shock   SUBJECTIVE: On high FIO2 & PEEP, satn in the 70s On high doses levophed & vaso Hypothermic, unresponsive   VITAL SIGNS: Temp:  [85.3 F (29.6 C)-97.5 F (36.4 C)] 97.5 F (36.4 C) (12/05 1130) Pulse Rate:  [51-97] 73 (12/05 1130) Resp:  [11-30] 30 (12/05 1145) BP: (61-116)/(37-63) 76/42 mmHg (12/05 1000) SpO2:  [67 %-100 %] 67 % (12/05 1145) FiO2 (%):  [100 %] 100 % (12/05 1200) Weight:  [29.9 kg (65 lb 14.7 oz)-39.463 kg (87 lb)] 39.463 kg (87 lb) (12/05 0248) HEMODYNAMICS: CVP:  [24 mmHg] 24 mmHg VENTILATOR SETTINGS: Vent Mode:  [-] PRVC FiO2 (%):  [100 %] 100 % Set Rate:  [14 bmp-30 bmp] 30 bmp Vt Set:  [300 mL] 300 mL PEEP:  [5 cmH20-20 cmH20] 20 cmH20 Plateau Pressure:  [35 cmH20-52 cmH20] 38 cmH20 INTAKE / OUTPUT:  Intake/Output Summary (Last 24 hours) at 12/12/2014 1430 Last data filed at 12/04/2014 1200  Gross per 24 hour  Intake 4492.03 ml  Output    190 ml  Net 4302.03 ml    PHYSICAL EXAMINATION: General:  Acutely ill, intubated Neuro:  sedated HEENT:  Microcephalic, PERRL, ET tube in place Cardiovascular:  Brady 50s, no MRG Lungs:  Bilateral diffuse crackles, diminished breath sounds, synchronous, peak pr 50 on PEEP 18 Abdomen:  Distended,  tympanic Musculoskeletal:  Contractures at wrists and elbows Skin:  mottled  LABS:  CBC  Recent Labs Lab 12/12/2014 0004 12/18/2014 0244 12/12/2014 0247 12/11/2014 0500  WBC 1.9*  --  1.9* 2.2*  HGB 14.9  --  10.1* 9.8*  HCT 42.2  --  29.5* 28.4*  PLT 26* 24* 22* 112*   Coag's  Recent Labs Lab 12/10/2014 0244 11/26/2014 0247  APTT 52* 57*  INR 1.33 1.40   BMET  Recent Labs Lab 12/01/2014 0004 12/02/2014 0247 12/01/2014 0500  NA 127* 129* 127*  K 4.5 3.8 4.1  CL 89* 99 96  CO2 24 18* 18*  BUN 14 11 11   CREATININE 0.32* 0.30* 0.31*  GLUCOSE 246* 289* 332*   Electrolytes  Recent Labs Lab 12/18/2014 0004 12/19/2014 0247 12/14/2014 0500  CALCIUM 9.0 6.0* 7.1*  MG  --  1.3* 3.1*  PHOS  --  3.4 2.7   Sepsis Markers  Recent Labs Lab 12/06/2014 0006 12/05/2014 0248 12/20/2014 0250 12/09/2014 0831  LATICACIDVEN 4.14*  --  3.8* 6.7*  PROCALCITON  --  30.99  --   --    ABG  Recent Labs Lab 12/13/2014 0550 11/27/2014 0900 12/19/2014 1148  PHART 7.283* 7.295* 7.164*  PCO2ART 32.1* 36.2 41.9  PO2ART VALUE BELOW REPORTABLE RANGE. 33.5* 42.0*   Liver Enzymes  Recent Labs Lab 12/04/2014 0247  AST 20  ALT 37*  ALKPHOS 94  BILITOT <0.2*  ALBUMIN 1.0*  Cardiac Enzymes  Recent Labs Lab 12/07/2014 0002 11/25/2014 0247 12/19/2014 0248 12/07/2014 0646  TROPONINI  --  <0.30  --  <0.30  PROBNP 444.6*  --  427.1*  --    Glucose  Recent Labs Lab 12/03/2014 0230 12/23/2014 0711 12/21/2014 1201  GLUCAP 318* 297* 278*    Imaging No results found.   ASSESSMENT / PLAN:  PULMONARY OETT: intubated in ED A: ARDS P:  Lung protective ventilation Goal pO2 >55 Permissive hypercapnea - target pH of 7.25 Add nimbex gtt    CARDIOVASCULAR CVL:IJ CVC A: Septic shock P:  Pressors to maintain MAP>65 Stress dose steroids Brady episode >> given atropine x 1   RENAL A:  hypocalcemic P:   Replete electrolytes as needed Monitor urine output.   GASTROINTESTINAL A:  Abdomen distended  and tympanic P:   KUB neg for free air  HEMATOLOGIC A:  Leukopenic, thrombocytopenic, coagulopathy P:  Like secondary to sepsis and possibly developing DIC Transfuse for platelets less than 10, Hgb less than 7, consider FFP for low fibrinogen  INFECTIOUS A:  Multifocal pna P:   BCx2 12/08/2014 UC December 08, 2014 Sputum 12/08/2014 Abx: Ceftriaxone x1, azithromycinx1 Vancomycin, Zosyn, Tobramycin, start date 11/30/2014, day 1/?  ENDOCRINE A:  Hyperglycemia   P:   Glucose stabilizer Cortisol pending given fluid refractory shock  NEUROLOGIC A:  CP P:  Sedated RASS goal: -3, -4    FAMILY  - Updates: Mother at bedside  - Inter-disciplinary family meet or Palliative Care meeting due by:  12/06/2014    TODAY'S SUMMARY: 24 y/o with CP and sz d/o admitted with septic shock, ARDS & refractory hypoxia. Unfortunately prognosis is grave, d.w mother & made DNR Tried nimbex but this did not improve her oxygenation, , not a candidate for proning, high risk transport for ECMO. She eventually had a bradycardic episode & passed away.  The patient is critically ill with multiple organ systems failure and requires high complexity decision making for assessment and support, frequent evaluation and titration of therapies, application of advanced monitoring technologies and extensive interpretation of multiple databases. Critical Care Time devoted to patient care services described in this note independent of APP time is 50 minutes.      Cyril Mourning MD. Tonny Bollman. [facility] Pager [phone] If no response call [phone]      12/10/2014, 2:30 PM

## 2014-12-26 NOTE — Progress Notes (Signed)
Pts HR dropped to 29 with irregular rythmn  intermittently. Nimbex was turned off and sedated decreased. MD notified

## 2014-12-26 NOTE — Progress Notes (Signed)
Critical ABG results shown to RN to notify MD.

## 2014-12-26 NOTE — ED Notes (Signed)
Dr Norlene Campbelltter preparing for central line placement.

## 2014-12-26 NOTE — Progress Notes (Signed)
Pt started to brady down to the 20s (see flowsheet for other VS). Pt's family has just left the room few minutes prior. MD was notified and staff called family back to the room immediately. Pt's heart rate intermittently increased to the 30s and 40s, but eventually dropped down to the 20s and to asystole with family present at bedside. Emotional support provided to pt's mother and sisters.  This RN and Burnard BuntingKatrice Keller, RN assessed pt and listen for HR; no heart sounds on auscultation, no pulse present and pupils were fixed and dilated. Pt was pronounced dead at 1330. MD notified

## 2014-12-26 NOTE — Progress Notes (Signed)
CRITICAL VALUE ALERT  Critical value received:  Calcium 6.0  Date of notification:  12/05/2014  Time of notification:  03:35  Critical value read back:Yes.    Nurse who received alert:  BShepherd, RN  MD notified (1st page):  Florestine AversGiddings, O.  Time of first page:  03:36  MD notified (2nd page):  Time of second page:  Responding MD:  Florestine AversGiddings, O.  Time MD responded:  03:36

## 2014-12-26 NOTE — ED Notes (Signed)
Attempt to call report to floor.  RN to call back.

## 2014-12-26 NOTE — H&P (Signed)
PULMONARY / CRITICAL CARE MEDICINE   Name: Allison [name] MRN: [id number] DOB: Sep 29, 1991    ADMISSION DATE:  Dec 25, 2014 CONSULTATION DATE:  12/17/2014  REFERRING MD :  EDP  CHIEF COMPLAINT:  Acute hypoxic respiratory failure, septic shock  INITIAL PRESENTATION: To ED from home with respiratory distress  STUDIES:  CXR - December 25, 2014 - multifocal pneumonia  SIGNIFICANT EVENTS: Intubated in ED on 2014-12-25 CVC placed in ED 12/25/14 Femoral A-line placed 12/18/2014   HISTORY OF PRESENT ILLNESS:  24 y/o w/CP/MR, sz d/o and asthma presented with 1 week of cough and worsening shortness of breath.  On presentation to ED was hypoxic and hypothermic.  Was intubated in the ED.  Developed hypotension and was started on levophed.   PAST MEDICAL HISTORY :   has a past medical history of Seizures; Cerebral palsy; Asthma; and Pneumonia.  has past surgical history that includes Back surgery; Hip surgery; and PEG placement (July 2010). Prior to Admission medications   Medication Sig Start Date End Date Taking? Authorizing Provider  clonazePAM (KLONOPIN) 0.5 MG tablet Take 0.5 mg by mouth 3 (three) times daily.   Yes Historical Provider, MD  clonazePAM (KLONOPIN) 0.5 MG tablet Take 0.5 mg by mouth 2 (two) times daily as needed for anxiety.   Yes Historical Provider, MD  felbamate (FELBATOL) 600 MG/5ML suspension Take 600 mg by mouth 2 (two) times daily.   Yes Historical Provider, MD  lacosamide (VIMPAT) 200 MG TABS tablet 100 mg by Gastric Tube route every 6 (six) hours.   Yes Historical Provider, MD  levETIRAcetam (KEPPRA) 1000 MG tablet 1,000-1,500 mg by Gastric Tube route 2 (two) times daily. Take 1 and 1/2 tablets every morning and take 1 tablet every evening   Yes Historical Provider, MD  phenytoin (DILANTIN) 50 MG tablet Chew 50 mg by mouth 3 (three) times daily.   Yes Historical Provider, MD  pregabalin (LYRICA) 75 MG capsule Take 75 mg by mouth daily.   Yes Historical Provider, MD  clonazePAM  (KLONOPIN) 0.5 MG tablet TAKE 1 TABLET BY MOUTH THREE TIMES DAILY    Elveria Rising, NP  clonazePAM (KLONOPIN) 0.5 MG tablet Take 0.5 mg by mouth 2 (two) times daily as needed for anxiety.    Historical Provider, MD  felbamate (FELBATOL) 600 MG/5ML suspension TAKE 5 ML BY MOUTH TWICE DAILY 08/08/14   Elveria Rising, NP  ibuprofen (ADVIL,MOTRIN) 100 MG/5ML suspension Give 10 ml per G-tube every 4-6 hours PRN fever    Historical Provider, MD  levETIRAcetam (KEPPRA) 1000 MG tablet TAKE 1 AND 1/2 TABLETS BY MOUTH EVERY MORNING AND 1 TABLET AT NIGHT. CRUSH TABLETS AND PUT IN GTUBE 08/08/14   Elveria Rising, NP  LYRICA 75 MG capsule TAKE ONE CAPSULE BY MOUTH DAILY    Elveria Rising, NP  PHENYTOIN INFATABS 50 MG tablet CHEW AND SWALLOW 1 TABLET BY MOUTH EVERY 8 HOURS    Tina Goodpasture, NP  VIMPAT 200 MG TABS tablet TAKE 1/2 TABLET BY MOUTH EVERY 6 HOURS VIA GASTRIC TUBE 08/08/14   Elveria Rising, NP   No Known Allergies  FAMILY HISTORY:  indicated that her mother is alive. She indicated that her father is alive. She indicated that her sister is alive. She indicated that her maternal grandmother is deceased. She indicated that her maternal grandfather is deceased. She indicated that her paternal grandmother is deceased. She indicated that her paternal grandfather is deceased.  SOCIAL HISTORY:  reports that she has never smoked. She has never used smokeless tobacco. She reports  that she does not drink alcohol or use illicit drugs.  REVIEW OF SYSTEMS:  Could not be obtained as pt is inutbated  SUBJECTIVE:   VITAL SIGNS: Temp:  [90 F (32.2 C)] 90 F (32.2 C) (12/05 0010) Pulse Rate:  [54-72] 62 (12/05 0248) Resp:  [14-30] 17 (12/05 0200) BP: (61-95)/(37-49) 95/47 mmHg (12/05 0248) SpO2:  [87 %-100 %] 95 % (12/05 0248) FiO2 (%):  [100 %] 100 % (12/05 0248) Weight:  [29.9 kg (65 lb 14.7 oz)-39.463 kg (87 lb)] 39.463 kg (87 lb) (12/05 0248) HEMODYNAMICS:   VENTILATOR SETTINGS: Vent Mode:   [-] PRVC FiO2 (%):  [100 %] 100 % Set Rate:  [14 bmp-30 bmp] 30 bmp Vt Set:  [300 mL] 300 mL PEEP:  [5 cmH20] 5 cmH20 Plateau Pressure:  [35 cmH20-52 cmH20] 52 cmH20 INTAKE / OUTPUT:  Intake/Output Summary (Last 24 hours) at 07-21-14 0342 Last data filed at 07-21-14 0205  Gross per 24 hour  Intake   2000 ml  Output      0 ml  Net   2000 ml    PHYSICAL EXAMINATION: General:  Laying in bed, intubated Neuro:  sedated HEENT:  Microcephalic, PERRL, ET tube in place Cardiovascular:  Rate ~60, no MRG Lungs:  Bilateral diffuse crackles, diminished breath sounds Abdomen:  Distended, tympanic Musculoskeletal:  Contractures at wrists and elbows Skin:  mottled  LABS:  CBC  Recent Labs Lab 07-21-14 0004  WBC 1.9*  HGB 14.9  HCT 42.2  PLT 26*   Coag's  Recent Labs Lab 07-21-14 0247  APTT 57*  INR 1.40   BMET  Recent Labs Lab 07-21-14 0004 07-21-14 0247  NA 127* 129*  K 4.5 3.8  CL 89* 99  CO2 24 18*  BUN 14 11  CREATININE 0.32* 0.30*  GLUCOSE 246* 289*   Electrolytes  Recent Labs Lab 07-21-14 0004 07-21-14 0247  CALCIUM 9.0 6.0*  MG  --  1.3*  PHOS  --  3.4   Sepsis Markers  Recent Labs Lab 07-21-14 0006 07-21-14 0250  LATICACIDVEN 4.14* 3.8*   ABG  Recent Labs Lab 07-21-14 0203 07-21-14 0335  PHART 7.052* 7.229*  PCO2ART 63.3* 34.6*  PO2ART 64.0* 31.0*   Liver Enzymes  Recent Labs Lab 07-21-14 0247  AST 20  ALT 37*  ALKPHOS 94  BILITOT <0.2*  ALBUMIN 1.0*   Cardiac Enzymes  Recent Labs Lab 07-21-14 0002 07-21-14 0247 07-21-14 0248  TROPONINI  --  <0.30  --   PROBNP 444.6*  --  427.1*   Glucose No results for input(s): GLUCAP in the last 168 hours.  Imaging No results found.   ASSESSMENT / PLAN:  PULMONARY OETT: intubated in ED A: ARDS P:  Lung protective ventilation Goal pO2 >55 Permissive hypercapnea - target pH of 7.25    CARDIOVASCULAR CVL:IJ CVC A: Septic shock P:  Pressors to maintain  MAP>65   RENAL A:  hypocalcemic P:   Replete electrolytes as needed Monitor urine output.   GASTROINTESTINAL A:  Abdomen distended and tympanic3 P:   KUB to eval for free air  HEMATOLOGIC A:  Leukopenic, thrombocytopenic, coagulopathy P:  Like secondary to sepsis and possibly developing DIC Transfuse for platelets less than 10, Hgb less than 7, consider FFP for low fibrinogen  INFECTIOUS A:  Multifocal pna P:   BCx2 12/03/2014 UC 12/07/2014 Sputum 12/13/2014 Abx: Ceftriaxone x1, azithromycinx1 Vancomycin, Zosyn, Tobramycin, start date 12/01/2014, day 1/?  ENDOCRINE A:  Hyperglycemia   P:   Glucose stabilizer  Cortisol pending given fluid refractory shock  NEUROLOGIC A:  CP P:  Sedated RASS goal: -3, -4    FAMILY  - Updates: Mother at bedside  - Inter-disciplinary family meet or Palliative Care meeting due by:  12/06/2014    TODAY'S SUMMARY: 24 y/o with CP and sz d/o admitted with severe sepsis and acute hypoxic respiratory failure.      Pulmonary and Critical Care Medicine [facility] Pager: [phone]  12/10/2014, 3:42 AM

## 2014-12-26 NOTE — Progress Notes (Signed)
D/w Dr Norlene Campbelltter of ER  - patient now hypotensive She placed CVL - then bleeding around CVL. Platelet count now 26K: Will order 1 pack platelet   - also, abx coverage might need to be extended: fellow to decide   Dr. Kalman ShanMurali Chandlar Staebell, M.D., [facility]C.P Pulmonary and Critical Care Medicine Staff Physician [facility] [place] Pulmonary and Critical Care Pager: [phone], If no answer or between  15:00h - 7:00h: call [phone]  12/15/2014 1:58 AM

## 2014-12-26 NOTE — Progress Notes (Signed)
175 ml of Fentanyl and 3 ml of versed wasted in the sink witnessed by Burnard BuntingKatrice Keller, RN.

## 2014-12-26 NOTE — Progress Notes (Signed)
ANTIBIOTIC CONSULT NOTE - INITIAL  Pharmacy Consult for azithromycin and rocephin Indication: pneumonia  No Known Allergies  Patient Measurements:   Adjusted Body Weight:   Vital Signs: Temp: 90 F (32.2 C) (12/05 0010) Temp Source: Rectal (12/05 0010) BP: 66/37 mmHg (12/05 0100) Pulse Rate: 55 (12/05 0100) Intake/Output from previous day: 12/04 0701 - 12/05 0700 In: 1000 [I.V.:1000] Out: -  Intake/Output from this shift: Total I/O In: 1000 [I.V.:1000] Out: -   Labs:  Recent Labs  12/22/2014 0004  CREATININE 0.32*   CrCl cannot be calculated (Unknown ideal weight.). No results for input(s): VANCOTROUGH, VANCOPEAK, VANCORANDOM, GENTTROUGH, GENTPEAK, GENTRANDOM, TOBRATROUGH, TOBRAPEAK, TOBRARND, AMIKACINPEAK, AMIKACINTROU, AMIKACIN in the last 72 hours.   Microbiology: No results found for this or any previous visit (from the past 720 hour(s)).  Medical History: Past Medical History  Diagnosis Date  . Seizures   . Cerebral palsy   . Asthma   . Pneumonia     Medications:   (Not in a hospital admission) Assessment: 24 yo cerebal palsy patient with AMS and hypoxemia. Last weight recorded was ~30kg   Goal of Therapy:  Weigh adjust abx if needed.    Plan:  Azithromycin 500 iv q24h (max dose for weigh)  Rocephin iv 1gm q24h   Allison [name], Allison [name] Jonathan 12/24/2014,1:09 AM

## 2014-12-26 NOTE — Progress Notes (Signed)
[facility]   Cerebral palsy. Altered mental status, agonal, hypoxemic. Intubated in ER. BP soft, CXR with PNA.   Plan Admit orders from elink Central line might be risky in this patient Rx with fluids and neo via PIV Monitor lactate clearance Fellow Dr Belinda BlockGiddings to see   Dr. Kalman ShanMurali Manav [name], M.D., [facility]C.P Pulmonary and Critical Care Medicine Staff Physician [facility] [place] [facility] Pulmonary and Critical Care Pager: [phone], If no answer or between  15:00h - 7:00h: call [phone]  12/17/2014 12:43 AM

## 2014-12-26 NOTE — Procedures (Signed)
Arterial Catheter Insertion Procedure Note Allison [name] [id number] 23-May-1991  Procedure: Insertion of Arterial Catheter  Indications: Blood pressure monitoring  Procedure Details Consent: Unable to obtain consent because of emergent medical necessity. Time Out: Verified patient identification, verified procedure, site/side was marked, verified correct patient position, special equipment/implants available, medications/allergies/relevent history reviewed, required imaging and test results available.  Performed  Maximum sterile technique was used including antiseptics, cap, gloves, gown, hand hygiene, mask and sheet. Skin prep: Chlorhexidine; local anesthetic administered 20 gauge catheter was inserted into left femoral artery using the Seldinger technique.  Evaluation Blood flow good; BP tracing good. Complications: No apparent complications.   Allison [name] K. 12/25/2014

## 2014-12-26 NOTE — Progress Notes (Signed)
Recent Labs Lab 11/26/2014 0004 11/26/2014 0247  NA 127* 129*  K 4.5 3.8  CL 89* 99  CO2 24 18*  GLUCOSE 246* 289*  BUN 14 11  CREATININE 0.32* 0.30*  CALCIUM 9.0 6.0*  MG  --  1.3*  PHOS  --  3.4    Low MAG   Plan 4gm Mag sulfate   Dr. Kalman Shan, M.D., [facility].C.P Pulmonary and Critical Care Medicine Staff Physician [facility] [place] Pulmonary and Critical Care Pager: [phone], If no answer or between  15:00h - 7:00h: call [phone]  12/21/2014 4:37 AM

## 2014-12-26 NOTE — Progress Notes (Signed)
Pt was pronounced dead before extubation. Per MD to stop ventilation and extubate. RN and MD at bedside.

## 2014-12-26 NOTE — Progress Notes (Signed)
11/25/2014 1100  Clinical Encounter Type  Visited With Patient and family together;Health care provider  Visit Type Initial;Critical Care;Patient actively dying  Referral From Nurse  Spiritual Encounters  Spiritual Needs Emotional  Stress Factors  Family Stress Factors Health changes;Major life changes   Chaplain was paged to the patient's room at 10:09 AM. Chaplain was notified that the patient was not doing well. When chaplain arrived, patient was being visited by her mother. Patient's mother was crying and said "her daughter was not going to make it". Patient's mother said that the patient's sister, her other daughter was on the way. Chaplain provided social support to the mother until more family arrived. Patient's mother explained that the patient has been through a lot medically. Patient's sister arrived and the patient's mother updated the patient's sister on the patient's condition. Patient's sister wept. Chaplain introduced himself to patient's sister. Patient's mother said she had no other needs at this time and that a friend was coming to support her. Chaplain will continue to provide emotional and spiritual support for patient and patient's family as needed. Page Merrilyn Puma chaplain if further support needed this weekend. Cranston Neighbor, Chaplain  11:15 AM

## 2014-12-26 NOTE — ED Notes (Signed)
Dr. Norlene Campbelltter wants Antibioticsstarted before getting second set of cultures.

## 2014-12-26 NOTE — ED Notes (Signed)
Bair Hugger Applied ° °

## 2014-12-26 DEATH — deceased

## 2015-08-03 IMAGING — CR DG ABD PORTABLE 1V
1 series · 1 of 1 positions shown · non-contrast
Comparison: 04/21/2009

CLINICAL DATA: Abdominal distension

EXAM:
PORTABLE ABDOMEN - 1 VIEW

[supine ap]
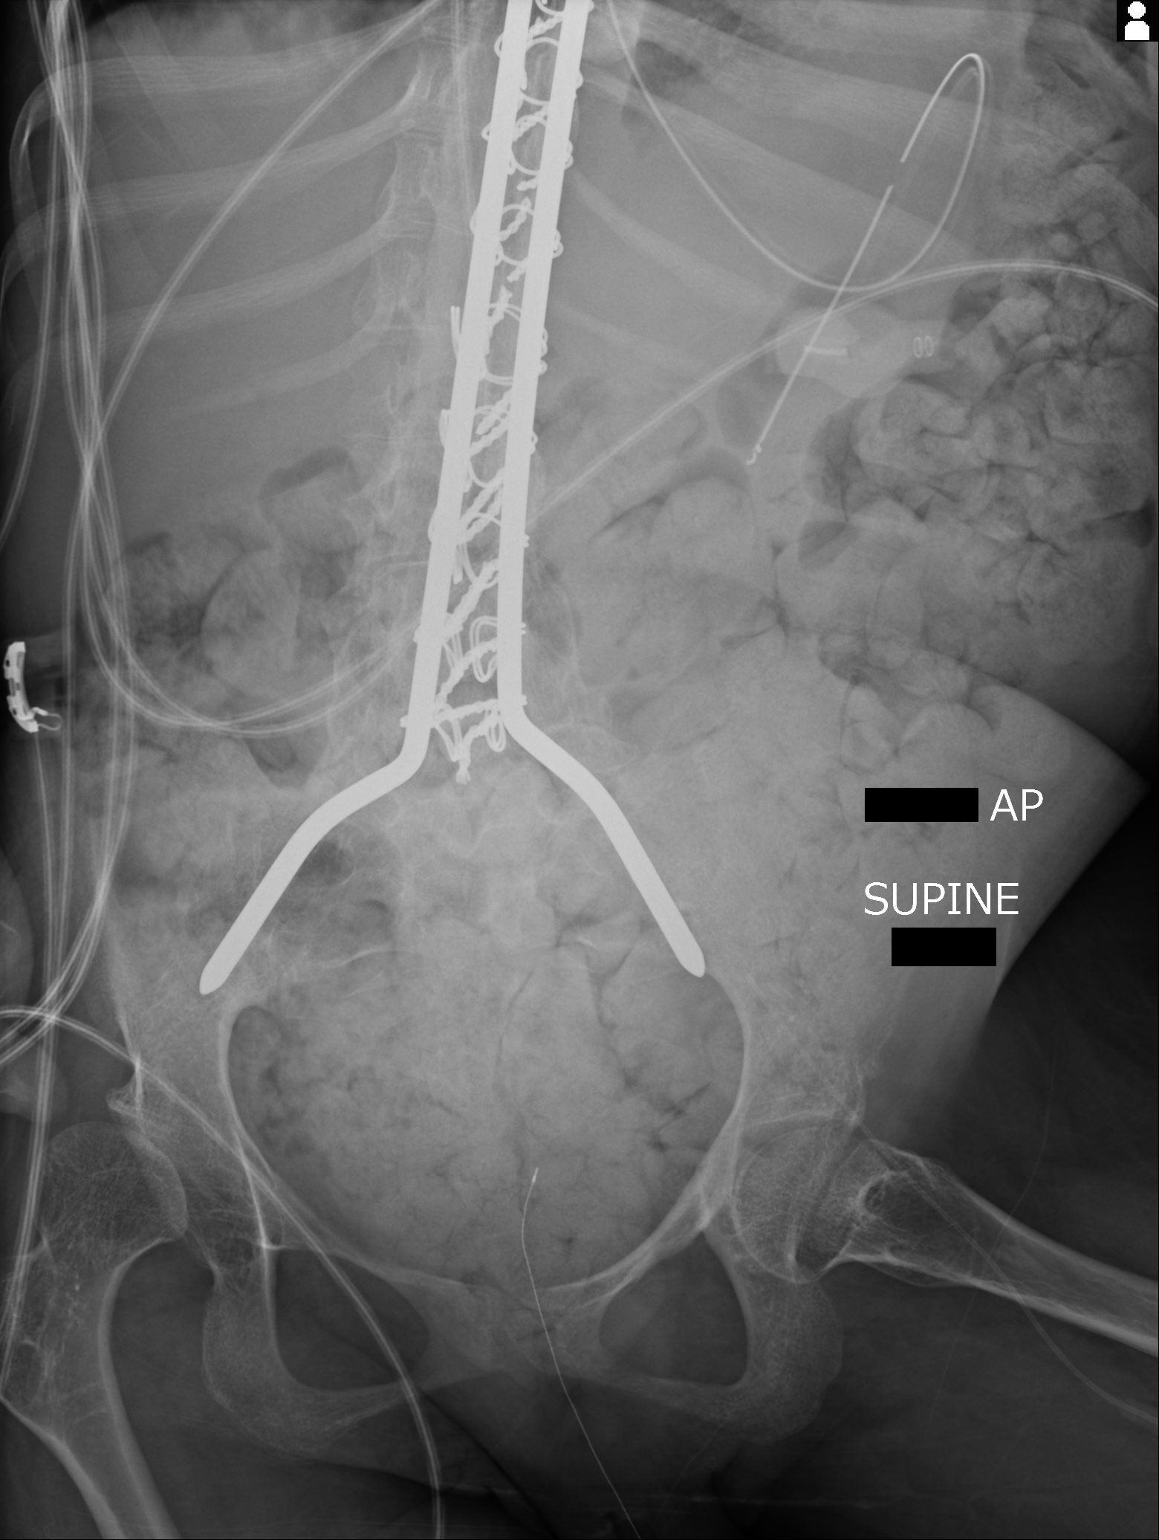

[1 of 1 positions shown; findings below may reference images not displayed]

FINDINGS: Large volume of retained formed stool throughout the colon
compatible with constipation. Stable fusion hardware of the spine
and pelvis. NG tube and gastrostomy tube noted. Chronic hip
dysplasia, worse on the right.
IMPRESSION: Large volume of retained formed stool compatible with constipation.

## 2015-08-03 IMAGING — CR DG CHEST 1V PORT
1 series · 1 of 1 positions shown · non-contrast
Comparison: 11/29/2014 radiographs.

CLINICAL DATA: Hypoxia.

EXAM:
PORTABLE CHEST - 1 VIEW

[portable]
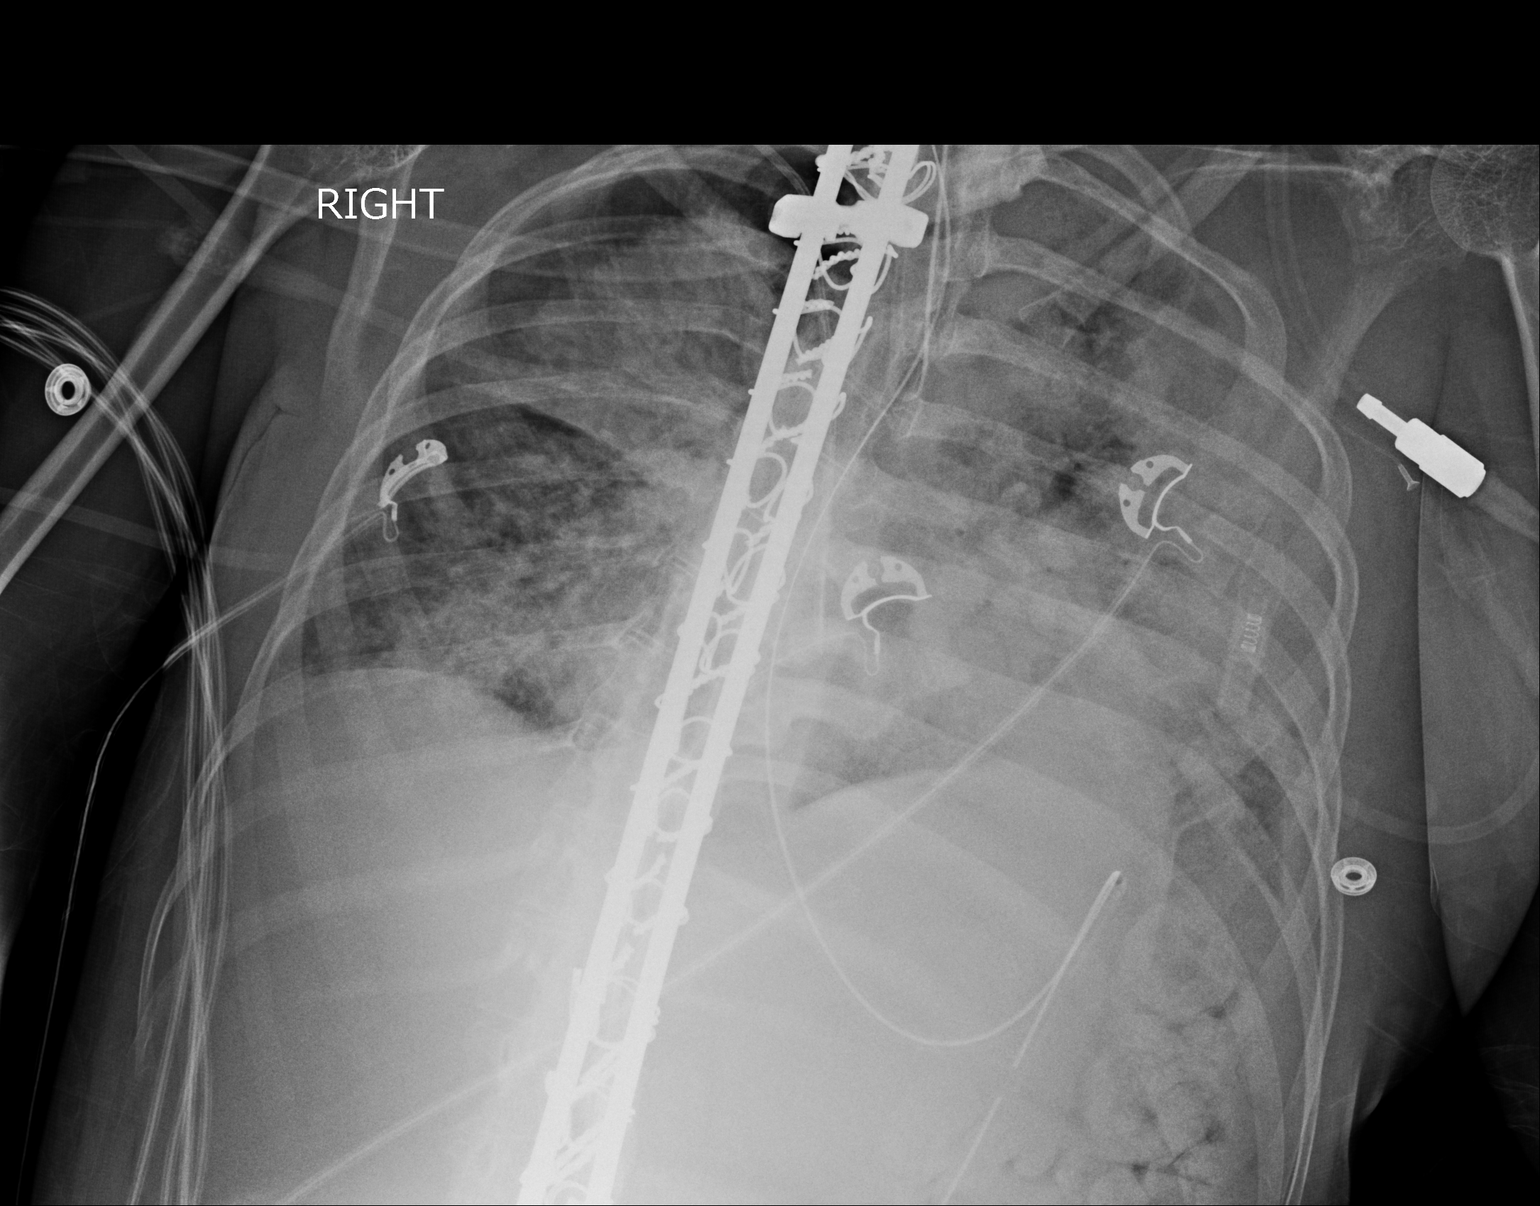

[1 of 1 positions shown; findings below may reference images not displayed]

FINDINGS: 2122 hr. Patient is mildly rotated to the left. Right IJ central
venous catheter tip is at the SVC right atrial level. The
nasogastric and endotracheal tubes appear grossly unchanged.
Extensive bilateral airspace opacities are again noted. There is
interval worsening at the right lung base. The left-sided components
do not appear significantly changed. There is no pneumothorax or
significant pleural effusion. Spinal fixation rods and scoliosis
again noted.
IMPRESSION: 1. Extensive bilateral airspace opacities with interval worsening of
the right basilar component. Findings are concerning for ARDS.
2. Stable support system.
